# Patient Record
Sex: Female | Born: 1985
Health system: Southern US, Community
[De-identification: ages and names within clinical notes are randomized; demographics above are authoritative.]

## PROBLEM LIST (undated history)

## (undated) ENCOUNTER — Emergency Department (HOSPITAL_COMMUNITY)

## (undated) DIAGNOSIS — E119 Type 2 diabetes mellitus without complications: Secondary | ICD-10-CM

## (undated) DIAGNOSIS — O139 Gestational [pregnancy-induced] hypertension without significant proteinuria, unspecified trimester: Secondary | ICD-10-CM

## (undated) HISTORY — PX: APPENDECTOMY: SHX54

---

## 2019-09-08 NOTE — L&D Delivery Note (Addendum)
Patient: Kristine Bryant MRN: [id number]  GBS status: GBS positive, intrapartum antibiotic ppx given- PCN  Patient is a 34 y.o. now G1P1 s/p NSVD at [redacted]w[redacted]d, who was admitted for IOL 2/2 pre-E w/o SF. Outpatient FB was placed on 11/16 and pt was started on pitocin on arrival to L&D on 11/17. AROM 8h 3m prior to delivery with clear fluid. Uncomplicated delivery as noted below.   Delivery Note At 11:55 PM a viable female was delivered via Vaginal, Spontaneous (Presentation: Left Occiput Anterior).  APGAR: 9, 9; weight per medical record.   Placenta status: Spontaneous, Intact.  Cord: 3 vessel with the following complications: None.  Cord pH: gases not collected  Head delivered LOA. No nuchal cord present. Loose foot cord x1. Shoulder and body delivered in usual fashion. Infant with spontaneous cry, placed on mother's abdomen, dried and bulb suctioned. Cord clamped x 2 after 1-minute delay, and cut by FOB. Cord blood drawn. Placenta delivered spontaneously with gentle cord traction. Fundus firm with massage and Pitocin. Perineum inspected and found to have no lacerations.   Anesthesia: Epidural Episiotomy: None Lacerations: None Suture Repair:  N/A Est. Blood Loss (mL): 25  Mom to postpartum.  Baby to Couplet care / Skin to Skin.  Simone Autry-Lott 07/25/2020, 12:15 AM  I was gloved and present for delivery of infant and placenta and agree with resident's documentation as noted above.  Sheila Oats, MD OB Fellow, Faculty Practice 07/25/2020 1:49 AM

## 2019-12-26 ENCOUNTER — Encounter (HOSPITAL_COMMUNITY): Payer: Self-pay | Admitting: Family Medicine

## 2019-12-26 ENCOUNTER — Other Ambulatory Visit: Payer: Self-pay

## 2019-12-26 ENCOUNTER — Inpatient Hospital Stay (HOSPITAL_COMMUNITY)
Admission: AD | Admit: 2019-12-26 | Discharge: 2019-12-27 | Disposition: A | Discharge status: Home / Self Care | Payer: Self-pay | Attending: Family Medicine | Admitting: Family Medicine

## 2019-12-26 DIAGNOSIS — O209 Hemorrhage in early pregnancy, unspecified: Secondary | ICD-10-CM | POA: Insufficient documentation

## 2019-12-26 DIAGNOSIS — O3680X0 Pregnancy with inconclusive fetal viability, not applicable or unspecified: Secondary | ICD-10-CM

## 2019-12-26 DIAGNOSIS — Z3A08 8 weeks gestation of pregnancy: Secondary | ICD-10-CM | POA: Insufficient documentation

## 2019-12-26 LAB — POC URINE PREG, ED: Preg Test, Ur: POSITIVE — AB

## 2019-12-26 NOTE — MAU Note (Signed)
PT SAYS WITH INTERPRETER - [phone]- SAYS STARTED BLEEDING  8PM- ON UNDERWEAR . CRAMPS STARTED  STARTED - NO MEDS .

## 2019-12-26 NOTE — ED Notes (Signed)
Pt assessed by Sharilyn Sites, PA, ok to go to MAU. Report given to MAU and transport service called to transport pt to MAU.

## 2019-12-26 NOTE — MAU Provider Note (Signed)
Chief Complaint: Vaginal Bleeding   First Provider Initiated Contact with Patient 12/26/19 2349        SUBJECTIVE HPI: Kristine Bryant is a 34 y.o. G1P0 at [redacted]w[redacted]d by LMP who presents to maternity admissions reporting vaginal bleeding since 8pm.  Very little cramping  Had some blood tests "at [facility]" but plans care at [facility].  Has not had an [place] yet. . She denies vaginal itching/burning, urinary symptoms, h/a, dizziness, n/v, or fever/chills.    Vaginal Bleeding The patient's primary symptoms include pelvic pain and vaginal bleeding. The patient's pertinent negatives include no genital itching, genital lesions, genital odor or vaginal discharge. This is a new problem. The current episode started today. The problem occurs intermittently. The problem has been resolved. The pain is mild. She is pregnant. Pertinent negatives include no back pain, chills, constipation, diarrhea, dysuria, fever, frequency, headaches, nausea or vomiting. The vaginal discharge was bloody. The vaginal bleeding is spotting. She has not been passing clots. She has not been passing tissue. Nothing aggravates the symptoms. She has tried nothing for the symptoms.   RN Note: PT SAYS WITH INTERPRETER - [phone]- SAYS STARTED BLEEDING  8PM- ON UNDERWEAR . CRAMPS STARTED  STARTED - NO MEDS .  No past medical history on file.  Social History   Socioeconomic History  . Marital status: Single    Spouse name: Not on file  . Number of children: Not on file  . Years of education: Not on file  . Highest education level: Not on file  Occupational History  . Not on file  Tobacco Use  . Smoking status: Not on file  Substance and Sexual Activity  . Alcohol use: Not on file  . Drug use: Not on file  . Sexual activity: Not on file  Other Topics Concern  . Not on file  Social History Narrative  . Not on file   Social Determinants of Health   Financial Resource Strain:   . Difficulty of Paying Living Expenses:    Food Insecurity:   . Worried About Charity fundraiser in the Last Year:   . Arboriculturist in the Last Year:   Transportation Needs:   . Film/video editor (Medical):   Marland Kitchen Lack of Transportation (Non-Medical):   Physical Activity:   . Days of Exercise per Week:   . Minutes of Exercise per Session:   Stress:   . Feeling of Stress :   Social Connections:   . Frequency of Communication with Friends and Family:   . Frequency of Social Gatherings with Friends and Family:   . Attends Religious Services:   . Active Member of Clubs or Organizations:   . Attends Archivist Meetings:   Marland Kitchen Marital Status:   Intimate Partner Violence:   . Fear of Current or Ex-Partner:   . Emotionally Abused:   Marland Kitchen Physically Abused:   . Sexually Abused:    No current facility-administered medications on file prior to encounter.   No current outpatient medications on file prior to encounter.   No Known Allergies  I have reviewed patient's Past Medical Hx, Surgical Hx, Family Hx, Social Hx, medications and allergies.   ROS:  Review of Systems  Constitutional: Negative for chills and fever.  Gastrointestinal: Negative for constipation, diarrhea, nausea and vomiting.  Genitourinary: Positive for pelvic pain and vaginal bleeding. Negative for dysuria, frequency and vaginal discharge.  Musculoskeletal: Negative for back pain.  Neurological: Negative for headaches.   Review  of Systems  Other systems negative   Physical Exam  Physical Exam Patient Vitals for the past 24 hrs:  BP Temp Temp src Pulse Resp SpO2  12/26/19 2223 114/88 98 F (36.7 C) Oral 82 16 100 %   Constitutional: Well-developed, well-nourished female in no acute distress.  Cardiovascular: normal rate Respiratory: normal effort GI: Abd soft, non-tender. Pos BS x 4 MS: Extremities nontender, no edema, normal ROM Neurologic: Alert and oriented x 4.  GU: Neg CVAT.  PELVIC EXAM: Cervix pink, visually closed, without  lesion, scant white creamy discharge, vaginal walls and external genitalia normal   No blood visible  LAB RESULTS Results for orders placed or performed during the hospital encounter of 12/26/19 (from the past 24 hour(s))  POC Urine Pregnancy, ED (not at [facility])     Status: Abnormal   Collection Time: 12/26/19 10:33 PM  Result Value Ref Range   Preg Test, Ur POSITIVE (A) NEGATIVE  CBC     Status: None   Collection Time: 12/26/19 11:45 PM  Result Value Ref Range   WBC 10.2 4.0 - 10.5 K/uL   RBC 4.36 3.87 - 5.11 MIL/uL   Hemoglobin 13.3 12.0 - 15.0 g/dL   HCT 74.8 27.0 - 78.6 %   MCV 91.1 80.0 - 100.0 fL   MCH 30.5 26.0 - 34.0 pg   MCHC 33.5 30.0 - 36.0 g/dL   RDW 75.4 49.2 - 01.0 %   Platelets 303 150 - 400 K/uL   nRBC 0.0 0.0 - 0.2 %  hCG, quantitative, pregnancy     Status: Abnormal   Collection Time: 12/26/19 11:45 PM  Result Value Ref Range   hCG, Beta Chain, Quant, S 96,991 (H) <5 mIU/mL  ABO/Rh     Status: None   Collection Time: 12/26/19 11:45 PM  Result Value Ref Range   ABO/RH(D) O POS    No rh immune globuloin      NOT A RH IMMUNE GLOBULIN CANDIDATE, PT RH POSITIVE Performed at [facility], [address], [place], [place] [address]   Urinalysis, Routine w reflex microscopic     Status: None   Collection Time: 12/27/19 12:06 AM  Result Value Ref Range   Color, Urine YELLOW YELLOW   APPearance CLEAR CLEAR   Specific Gravity, Urine 1.012 1.005 - 1.030   pH 7.0 5.0 - 8.0   Glucose, UA NEGATIVE NEGATIVE mg/dL   Hgb urine dipstick NEGATIVE NEGATIVE   Bilirubin Urine NEGATIVE NEGATIVE   Ketones, ur NEGATIVE NEGATIVE mg/dL   Protein, ur NEGATIVE NEGATIVE mg/dL   Nitrite NEGATIVE NEGATIVE   Leukocytes,Ua NEGATIVE NEGATIVE  Wet prep, genital     Status: Abnormal   Collection Time: 12/27/19 12:15 AM  Result Value Ref Range   Yeast Wet Prep HPF POC NONE SEEN NONE SEEN   Trich, Wet Prep NONE SEEN NONE SEEN   Clue Cells Wet Prep HPF POC PRESENT (A) NONE SEEN    WBC, Wet Prep HPF POC MODERATE (A) NONE SEEN   Sperm NONE SEEN     IMAGING US OB Comp Less 14 Wks  Result Date: 12/27/2019 CLINICAL DATA:  Bleeding EXAM: OBSTETRIC <14 WK [place] AND TRANSVAGINAL OB US TECHNIQUE: Both transabdominal and transvaginal ultrasound examinations were performed for complete evaluation of the gestation as well as the maternal uterus, adnexal regions, and pelvic cul-de-sac. Transvaginal technique was performed to assess early pregnancy. COMPARISON:  None. FINDINGS: Intrauterine gestational sac: Single Yolk sac:  Visualized Embryo:  Visualized Cardiac Activity:  Visualized Heart Rate: 157 bpm MSD:   mm    w     d CRL:  11.4 mm   7 w   1 d                  [place] EDC: 08/13/2020 Subchorionic hemorrhage:  None visualized. Maternal uterus/adnexae: No adnexal mass or free fluid. IMPRESSION: Seven week 1 day intrauterine pregnancy. Fetal heart rate 157 beats per minute. No acute maternal findings. Electronically Signed   By: Charlett Nose M.D.   On: 12/27/2019 00:51     MAU Management/MDM: Ordered usual first trimester r/o ectopic labs.   Pelvic exam and cultures done Will check baseline Ultrasound to rule out ectopic.  This bleeding/pain can represent a normal pregnancy with bleeding, spontaneous abortion or even an ectopic which can be life-threatening.  The process as listed above helps to determine which of these is present.  Reviewed findings with patient who is pleased Reviewed she may continue to see some spotting from time to time Bleeding and pain precautions reviewed  ASSESSMENT Pregnancy at [redacted]w[redacted]d Bleeding in early pregnancy   PLAN Discharge home Pelvic rest Bleeding precautions Encouraged to seek prenatal care  Pt stable at time of discharge. Encouraged to return here or to other Urgent Care/ED if she develops worsening of symptoms, increase in pain, fever, or other concerning symptoms.    Wynelle Bourgeois CNM, MSN Certified Nurse-Midwife 12/26/2019  11:49  PM

## 2019-12-26 NOTE — ED Notes (Signed)
Sharilyn Sites PA called to assess pt on triage.

## 2019-12-26 NOTE — ED Provider Notes (Signed)
  MSE was initiated and I personally evaluated the patient and placed orders (if any) at  10:52 PM on December 26, 2019.  33 y.o. F here with vaginal bleeding.  History a little difficult due to language barrier, obtained with assistance from triage RN.  Reports she is about [redacted] weeks gestation.  Pregnancy test here is positive.  She is hemodynamically stable so will transfer to MAU for further work-up and care.  The patient appears stable so that the remainder of the MSE may be completed by another provider.    Garlon Hatchet, PA-C 12/26/19 2254    Gerhard Munch, MD 01/01/20 [phone]

## 2019-12-27 ENCOUNTER — Inpatient Hospital Stay (HOSPITAL_COMMUNITY): Payer: Self-pay

## 2019-12-27 DIAGNOSIS — O209 Hemorrhage in early pregnancy, unspecified: Secondary | ICD-10-CM

## 2019-12-27 DIAGNOSIS — O3680X0 Pregnancy with inconclusive fetal viability, not applicable or unspecified: Secondary | ICD-10-CM

## 2019-12-27 DIAGNOSIS — Z3A08 8 weeks gestation of pregnancy: Secondary | ICD-10-CM

## 2019-12-27 LAB — CBC
HCT: 39.7 % (ref 36.0–46.0)
Hemoglobin: 13.3 g/dL (ref 12.0–15.0)
MCH: 30.5 pg (ref 26.0–34.0)
MCHC: 33.5 g/dL (ref 30.0–36.0)
MCV: 91.1 fL (ref 80.0–100.0)
Platelets: 303 10*3/uL (ref 150–400)
RBC: 4.36 MIL/uL (ref 3.87–5.11)
RDW: 12.8 % (ref 11.5–15.5)
WBC: 10.2 10*3/uL (ref 4.0–10.5)
nRBC: 0 % (ref 0.0–0.2)

## 2019-12-27 LAB — WET PREP, GENITAL
Sperm: NONE SEEN
Trich, Wet Prep: NONE SEEN
Yeast Wet Prep HPF POC: NONE SEEN

## 2019-12-27 LAB — GC/CHLAMYDIA PROBE AMP (~~LOC~~) NOT AT ARMC
Chlamydia: NEGATIVE
Comment: NEGATIVE
Comment: NORMAL
Neisseria Gonorrhea: NEGATIVE

## 2019-12-27 LAB — HCG, QUANTITATIVE, PREGNANCY: hCG, Beta Chain, Quant, S: 96991 m[IU]/mL — ABNORMAL HIGH (ref <5)

## 2019-12-27 LAB — URINALYSIS, ROUTINE W REFLEX MICROSCOPIC
Bilirubin Urine: NEGATIVE
Glucose, UA: NEGATIVE mg/dL
Hgb urine dipstick: NEGATIVE
Ketones, ur: NEGATIVE mg/dL
Leukocytes,Ua: NEGATIVE
Nitrite: NEGATIVE
Protein, ur: NEGATIVE mg/dL
Specific Gravity, Urine: 1.012 (ref 1.005–1.030)
pH: 7 (ref 5.0–8.0)

## 2019-12-27 LAB — HIV ANTIBODY (ROUTINE TESTING W REFLEX): HIV Screen 4th Generation wRfx: NONREACTIVE

## 2019-12-27 LAB — ABO/RH: ABO/RH(D): O POS

## 2019-12-27 NOTE — Discharge Instructions (Signed)
Primer trimestre de Psychiatrist First Trimester of Pregnancy  El primer trimestre de Psychiatrist se extiende desde la semana1 hasta el final de la semana13 (mes1 al mes3). Durante este tiempo, el beb comenzar a desarrollarse dentro suyo. Entre la semana6 y [place], se forman los ojos y Recruitment consultant, y los latidos del corazn pueden escucharse en la ecografa. Al final de las 12semanas, todos los rganos del beb estn formados. El cuidado prenatal es toda la asistencia mdica que usted recibe antes del nacimiento del beb. Asegrese de recibir un buen cuidado prenatal y de seguir todas las indicaciones del mdico. Siga estas indicaciones en su casa: Medicamentos  Tome los medicamentos de venta libre y los recetados solamente como se lo haya indicado el mdico. Algunos medicamentos son seguros para tomar durante el Psychiatrist y otros no lo son.  Tome vitaminas prenatales que contengan por lo menos (?g) de cido flico.  Si tiene problemas para defecar (estreimiento), tome un medicamento que ablanda la materia fecal (laxante), siempre que lo autorice el mdico. Comida y bebida   Ingiera alimentos saludables de [place] regular.  El Firefighter la cantidad de peso que [place].  No coma carne cruda ni quesos sin cocinar.  Si tiene Programme researcher, broadcasting/film/video (nuseas) o vomita (vmitos): ? Ingiera 4 o 5comidas pequeas por Geophysical data processor de 3abundantes. ? Intente comer algunas galletitas saladas. ? Beba lquidos [employer], en lugar de [employer].  Para evitar el estreimiento: ? Consuma alimentos ricos en fibra, como frutas y verduras frescas, cereales integrales y legumbres. ? Beba suficiente lquido para mantener el pis (orina) claro o de color amarillo plido. Actividad  Haga ejercicios solamente como se lo haya indicado el mdico. Deje de hacer ejercicios si tiene clicos o dolor en la parte baja del vientre (abdomen) o en la cintura.  No haga  actividad fsica si el clima est demasiado caluroso o hmedo, o si se encuentra en un lugar muy alto (altitud elevada).  Intente no estar de pie FedEx. Mueva las piernas con frecuencia si debe estar de pie en un lugar durante mucho tiempo.  Evite levantar pesos [employer]ands.  Use zapatos con tacones bajos. Mantenga una buena postura al sentarse y pararse.  Puede tener [employer] [place] [employer], a menos que el mdico le indique lo contrario. Alivio del dolor y del Dentist  Use un sostn que le brinde buen soporte si le duelen las [place].  Dese baos de asiento con agua tibia para Engineer, materials o las molestias causadas por las hemorroides. Use una crema antihemorroidal si el mdico se lo permite.  Descanse con las piernas elevadas si tiene calambres o dolor de cintura.  Si tiene las venas de las piernas hinchadas y abultadas (venas varicosas): ? Use medias elsticas de soporte o medias de compresin como se lo haya indicado el mdico. ? Levante (eleve) los pies durante , 3 o 4veces por Futures trader. ? Limite la sal en sus alimentos. Cuidado prenatal  Programe las visitas prenatales para la semana12 de [place].  Escriba sus preguntas. Llvelas cuando concurra a las visitas prenatales.  Concurra a todas las visitas prenatales como se lo haya indicado el mdico. Esto es importante. Seguridad  Use el cinturn de seguridad en todo momento mientras conduce.  Haga una lista con los nmeros de telfono en caso de Associate Professor. Esta lista debe incluir los nmeros de los familiares, los amigos, el hospital y los departamentos de polica y de bomberos. Instrucciones generales  Pdale  al mdico que la derive a clases prenatales en su localidad. Debe comenzar a tomar las clases antes de entrar en el mes6 de embarazo.  Pida ayuda si necesita asesoramiento o asistencia con la alimentacin. El mdico puede aconsejarla o indicarle dnde recurrir para recibir ayuda.  No se d baos de  inmersin en agua caliente, baos turcos ni saunas.  No se haga duchas vaginales ni use tampones o toallas higinicas perfumadas.  No mantenga las piernas cruzadas durante mucho tiempo.  Evite las hierbas y el alcohol. Evite los frmacos que el mdico no haya autorizado.  No consuma ningn producto que contenga tabaco, lo que incluye cigarrillos, tabaco de mascar o cigarrillos electrnicos. Si necesita ayuda para dejar de fumar, consulte al mdico. Puede recibir asesoramiento u otro tipo de apoyo para dejar de fumar.  Evite el contacto con las bandejas sanitarias de los gatos y la tierra que estos animales usan. Estos elementos contienen grmenes que pueden causar defectos congnitos al beb y la posible prdida del feto (aborto espontneo) o muerte fetal.  Visite al dentista. En su casa, lvese los dientes con un cepillo dental suave. Psese el hilo dental con suavidad. Comunquese con un mdico si:  Tiene mareos.  Tiene clicos leves o siente presin en la parte baja del vientre.  Sufre un dolor persistente en el abdomen.  Sigue teniendo malestar estomacal, vomita o la materia fecal es lquida (diarrea).  Nota una secrecin de lquido con olor ftido que proviene de la vagina.  Tiene dolor al hacer pis (orinar).  Tiene el rostro, las manos, las piernas o los tobillos ms hinchados (inflamados). Solicite ayuda de inmediato si:  Tiene fiebre.  Tiene una prdida de lquido por la vagina.  Tiene sangrado o pequeas prdidas vaginales.  Tiene clicos o dolor muy intensos en el vientre.  Sube o baja de peso rpidamente.  Vomita sangre. Esto tiene un aspecto similar a la borra del caf.  Est en contacto con personas que tienen rubola, la quinta enfermedad o varicela.  Siente un dolor de cabeza muy intenso.  Le falta el aire.  Sufre cualquier tipo de traumatismo, por ejemplo, debido a una cada o un accidente automovilstico. Resumen  El primer trimestre de embarazo se  extiende desde la semana1 hasta el final de la semana13 (mes1 al mes3).  Para cuidar su salud y la del beb en gestacin, necesitar consumir alimentos saludables, tomar medicamentos solamente si lo autoriza el mdico, y hacer actividades que sean seguras para usted y para su beb.  Concurra a todas las visitas de control como se lo haya indicado el mdico. Esto es importante porque el mdico deber asegurar que el beb est saludable y est creciendo bien. Esta informacin no tiene como fin reemplazar el consejo del mdico. Asegrese de hacerle al mdico cualquier pregunta que tenga. Document Revised: 03/30/2017 Document Reviewed: 03/30/2017 Elsevier Patient Education  2020 Elsevier Inc.  

## 2020-01-31 ENCOUNTER — Other Ambulatory Visit: Payer: Self-pay | Admitting: Family Medicine

## 2020-01-31 ENCOUNTER — Other Ambulatory Visit: Payer: Self-pay

## 2020-01-31 DIAGNOSIS — Z3401 Encounter for supervision of normal first pregnancy, first trimester: Secondary | ICD-10-CM

## 2020-01-31 NOTE — Progress Notes (Signed)
Labs placed. PCP for Pregnancy will be Dr. Clent Ridges.   Terisa Starr, MD  Family Medicine Teaching Service

## 2020-02-02 LAB — OBSTETRIC PANEL, INCLUDING HIV
Antibody Screen: NEGATIVE
Basophils Absolute: 0 10*3/uL (ref 0.0–0.2)
Basos: 1 %
EOS (ABSOLUTE): 0.1 10*3/uL (ref 0.0–0.4)
Eos: 1 %
HIV Screen 4th Generation wRfx: NONREACTIVE
Hematocrit: 35.8 % (ref 34.0–46.6)
Hemoglobin: 11.9 g/dL (ref 11.1–15.9)
Hepatitis B Surface Ag: NEGATIVE
Immature Grans (Abs): 0 10*3/uL (ref 0.0–0.1)
Immature Granulocytes: 0 %
Lymphocytes Absolute: 2.6 10*3/uL (ref 0.7–3.1)
Lymphs: 32 %
MCH: 29.7 pg (ref 26.6–33.0)
MCHC: 33.2 g/dL (ref 31.5–35.7)
MCV: 89 fL (ref 79–97)
Monocytes Absolute: 0.6 10*3/uL (ref 0.1–0.9)
Monocytes: 7 %
Neutrophils Absolute: 5 10*3/uL (ref 1.4–7.0)
Neutrophils: 59 %
Platelets: 253 10*3/uL (ref 150–450)
RBC: 4.01 x10E6/uL (ref 3.77–5.28)
RDW: 12.7 % (ref 11.7–15.4)
RPR Ser Ql: NONREACTIVE
Rh Factor: POSITIVE
Rubella Antibodies, IGG: 11.5 index (ref >0.99)
WBC: 8.3 10*3/uL (ref 3.4–10.8)

## 2020-02-02 LAB — HGB FRACTIONATION CASCADE
Hgb A2: 2.7 % (ref 1.8–3.2)
Hgb A: 97.3 % (ref 96.4–98.8)
Hgb F: 0 % (ref 0.0–2.0)
Hgb S: 0 %

## 2020-02-04 LAB — URINE CULTURE, OB REFLEX

## 2020-02-04 LAB — CULTURE, OB URINE

## 2020-02-06 NOTE — Progress Notes (Signed)
Patient Name: Kristine Bryant Date of Birth: 1986/03/06 Rule Initial Prenatal Visit  Kristine Bryant is a 34 y.o. year old G1P0 at [redacted]w[redacted]d who presents for her initial prenatal visit. Pregnancy is planned She reports morning sickness. She is taking a prenatal vitamin.  She denies pelvic pain or vaginal bleeding.   Pregnancy Dating: . The patient is dated by ultrasound  . LMP: 10/25/19 . Period is certain:  No.  . Periods were regular:  Yes.  Marland Kitchen LMP was a typical period:  Yes.  . Using hormonal contraception in 3 months prior to conception: No  Lab Review: . Blood type: O . Rh Status: + . Antibody screen: Negative . HIV: Negative . RPR: Negative . Hemoglobin electrophoresis reviewed: Yes . Results of OB urine culture are: Positive for GBS bacteriuria  . Rubella: Immune . Varicella status is Unknown  PMH: Reviewed and as detailed below: . HTN: No  . Type 1 or 2 Diabetes: No  . Depression:  No  . Seizure disorder:  No . VTE: No ,  . History of STI No,  . Abnormal Pap smear:  No, . Genital herpes simplex:  No   PSH: . Gynecologic Surgery:  no . Surgical history reviewed, notable for: none  Obstetric History: . Obstetric history tab updated and reviewed.  . Summary of prior pregnancies: 0 . Cesarean delivery: n/a . Gestational Diabetes:  n/a . Hypertension in pregnancy: n/a . History of preterm birth: n/a . History of LGA/SGA infant:  n/a . History of shoulder dystocia: n/a . Indications for referral were reviewed, and the patient has no obstetric indications for referral to [facility] at this time.   Social History: . Partner's name: Alison Stalling . Tobacco use: No . Alcohol use:  No . Other substance use:  No  Current Medications:  . Prenatal Vitamens  . Reviewed and appropriate in pregnancy.   Genetic and Infection Screen: . Flow Sheet Updated Yes  Prenatal Exam: Gen: Well nourished, well developed.  No  distress.  Vitals noted. HEENT: Normocephalic, atraumatic.  Neck supple without cervical lymphadenopathy, thyromegaly or thyroid nodules.  Fair dentition. CV: RRR no murmur, gallops or rubs Lungs: CTA B.  Normal respiratory effort without wheezes or rales. Abd: soft, NTND. +BS.  Uterus not appreciated above pelvis. GU: Normal external female genitalia without lesions.  Nl vaginal, well rugated without lesions. No vaginal discharge.  Bimanual exam: No adnexal mass or TTP. No CMT.  Uterus size  Ext: No clubbing, cyanosis or edema. Psych: Normal grooming and dress.  Not depressed or anxious appearing.  Normal thought content and process without flight of ideas or looseness of associations, assessed via in person interpreter  Fetal heart tones: Appropriate  Assessment/Plan:  [name] is a 34 y.o. G1P0 at [redacted]w[redacted]d who presents to initiate prenatal care. She is doing well.  Current pregnancy issues include Nausea.  1. Routine prenatal care: Marland Kitchen As dating is not reliable, a dating ultrasound was ordered on 04/21. Dating tab updated. . Pre-pregnancy weight updated. Expected weight gain this pregnancy is 11-20 pounds  . Prenatal labs reviewed, notable for GBS bacteriuria. . Indications for referral to [facility] were reviewed and the patient does not meet criteria for referral.  . Medication list reviewed and updated.  . Recommended patient see a dentist for regular care.  . Bleeding and pain precautions reviewed. . Importance of prenatal vitamins reviewed.  . Genetic screening offered. Patient opted for: quad screen  at 16-20 weeks. . The patient will not be age 43 or over at time of delivery. Referral to genetic counseling was not offered today.  . The patient has the following risk factors for preexisting diabetes: BMI >25  and a FDR with diabetes . An early 1 hour glucose tolerance test was not ordered today.   . Pregnancy Medical Home and PHQ-9 forms completed, problems noted: Yes  2.  Discussed limit caffeine intake, avoid seafood high in mercury and alcohol.      Follow up 4 weeks for next prenatal visit.

## 2020-02-07 ENCOUNTER — Ambulatory Visit (INDEPENDENT_AMBULATORY_CARE_PROVIDER_SITE_OTHER): Payer: Self-pay | Admitting: Family Medicine

## 2020-02-07 ENCOUNTER — Other Ambulatory Visit: Payer: Self-pay

## 2020-02-07 ENCOUNTER — Other Ambulatory Visit (HOSPITAL_COMMUNITY)
Admission: RE | Admit: 2020-02-07 | Discharge: 2020-02-07 | Disposition: A | Discharge status: Home / Self Care | Payer: Self-pay | Source: Ambulatory Visit | Attending: Family Medicine | Admitting: Family Medicine

## 2020-02-07 ENCOUNTER — Encounter: Payer: Self-pay | Admitting: Family Medicine

## 2020-02-07 VITALS — BP 102/64 | HR 98 | Wt 176.4 lb

## 2020-02-07 DIAGNOSIS — Z3480 Encounter for supervision of other normal pregnancy, unspecified trimester: Secondary | ICD-10-CM

## 2020-02-07 DIAGNOSIS — Z3A13 13 weeks gestation of pregnancy: Secondary | ICD-10-CM | POA: Insufficient documentation

## 2020-02-07 DIAGNOSIS — O2341 Unspecified infection of urinary tract in pregnancy, first trimester: Secondary | ICD-10-CM | POA: Insufficient documentation

## 2020-02-07 DIAGNOSIS — B951 Streptococcus, group B, as the cause of diseases classified elsewhere: Secondary | ICD-10-CM | POA: Insufficient documentation

## 2020-02-07 DIAGNOSIS — Z8619 Personal history of other infectious and parasitic diseases: Secondary | ICD-10-CM | POA: Insufficient documentation

## 2020-02-07 HISTORY — DX: Encounter for supervision of other normal pregnancy, unspecified trimester: Z34.80

## 2020-02-07 MED ORDER — AMOXICILLIN 500 MG PO CAPS: 500.0000 mg | ORAL_CAPSULE | Freq: Three times a day (TID) | ORAL | 0 refills | Status: AC

## 2020-02-07 MED ORDER — DOXYLAMINE-PYRIDOXINE 10-10 MG PO TBEC: DELAYED_RELEASE_TABLET | ORAL | 3 refills | Status: DC

## 2020-02-07 MED FILL — AMOXICILLIN 500 MG CAPSULE: 500 | 5 days supply | Qty: 15 | Fill #0

## 2020-02-07 NOTE — Progress Notes (Signed)
Treated

## 2020-02-07 NOTE — Patient Instructions (Addendum)
It was a pleasure meeting you today.   You are now  13 weeks and 1 day  pregnant.  Your urine was positive for GBS and we will treat this with Amoxicillin.  It is important to take this medication until completed to prevent further complications in pregnancy.  Continue to prenatal vitamins daily  I have also sent a prescription for nausea medication  Follow up in 4 weeks   Primer trimestre de Psychiatrist First Trimester of Pregnancy El primer trimestre de Psychiatrist se extiende desde la semana1 hasta el final de la semana13 (mes1 al mes3). Una semana despus de que un espermatozoide fecunda un vulo, este se implantar en la pared uterina. Este embrin comenzar a Camera operator convertirse en un beb. Sus genes y los de su pareja forman el beb. Los genes del varn determinan si ser un nio o una nia. Entre la semana6 y [name], se forman los ojos y Recruitment consultant, y los latidos del corazn pueden verse en una ecografa. Al final de las 12semanas, todos los rganos del beb estn formados. Ahora que est embarazada, querr hacer todo lo que est a su alcance para tener un beb sano. Dos de las cosas ms importantes son Kristine Bryant un buen cuidado prenatal y seguir las indicaciones del mdico. El cuidado prenatal incluye toda la asistencia mdica que usted recibe antes del nacimiento del beb. Esto ayudar a Kristine Bryant, Kristine Bryant y tratar cualquier problema durante el embarazo y Kristine Bryant. Durante Financial risk analyst trimestre, ocurren cambios en el cuerpo. Su organismo atraviesa por muchos cambios durante el Kristine Bryant. Estos cambios varan de Kristine Bryant a Kristine Bryant.  Al principio, puede aumentar o bajar algunos kilos.  Puede tener Programme researcher, broadcasting/film/video (nuseas) y vomitar. Si no puede controlar los vmitos, llame al mdico.  Puede cansarse con facilidad.  Es posible que tenga dolores de cabeza que pueden aliviarse con ciertos medicamentos. Todos los Kristine Bryant que tome deben estar aprobados por el mdico.  Puede  orinar con mayor frecuencia. El dolor al orinar puede significar que usted tiene una infeccin de la vejiga.  Debido al Kristine Bryant, puede tener acidez estomacal.  Puede estar estreida, ya que ciertas hormonas enlentecen los movimientos de los msculos que empujan las heces a travs del intestino.  Pueden aparecer hemorroides o hincharse las venas (venas varicosas).  Las Kristine Bryant pueden empezar a Kristine Bryant y Kristine Bryant. Los pezones pueden sobresalir ms, y el tejido que los rodea (arola) tornarse ms oscuro.  Las Veterinary surgeon y estar sensibles al cepillado y al hilo dental.  Pueden aparecer zonas oscuras o manchas (cloasma, mscara del [place]) en el rostro. Esto probablemente se atenuar despus del nacimiento del beb.  Los perodos menstruales se interrumpirn.  Tal vez no tenga apetito.  Puede sentir un fuerte deseo de consumir ciertos alimentos.  Puede tener cambios a Theatre Bryant a da, por ejemplo, por momentos puede estar emocionada por el Psychiatrist y por otros preocuparse porque algo pueda salir mal con el embarazo o el beb.  Tendr sueos ms vvidos y extraos.  Tal vez haya cambios en el cabello. Esto cambios pueden incluir su engrosamiento, crecimiento rpido y Kristine Bryant textura. Adems, a algunas mujeres se les cae el cabello durante o despus del embarazo, o tienen el cabello seco o fino. Lo ms probable es que el cabello se le normalice despus del nacimiento del beb. Qu debe esperar en las visitas prenatales Durante una visita prenatal de rutina:  La pesarn para asegurarse de que usted y Kristine Bryant  beb estn creciendo normalmente.  Le tomarn la presin arterial.  Le medirn el abdomen para controlar el desarrollo del beb.  Se escucharn los latidos cardacos fetales entre las semanas10 y14 de Kristine Bryant.  Se analizarn los resultados de los estudios solicitados en visitas anteriores. El mdico puede preguntarle lo siguiente:  Cmo se  siente.  Si siente los movimientos del beb.  Si ha tenido sntomas anormales, como prdida de lquido, Kristine Bryant, dolores de cabeza intensos o clicos abdominales.  Si est consumiendo algn producto que contenga tabaco, como cigarrillos, tabaco de Higher education careers adviser y Psychologist, sport and exercise.  Si tiene [employer]. Otros estudios que pueden realizarse durante el primer trimestre incluyen lo siguiente:  Anlisis de sangre para determinar el grupo sanguneo y Hydrographic surveyor la presencia de infecciones previas. Las pruebas tambin se utilizarn para Teacher, adult education si tiene bajo nivel de hierro (anemia) y protenas en los glbulos rojos (anticuerpos Rh). En funcin de sus factores de riesgo, o si ya tuvo diabetes durante un embarazo anterior, le pueden hacer pruebas para determinar si tiene un nivel alto de azcar en la sangre, algo que puede afectar a embarazadas (diabetes gestacional).  Anlisis de orina para detectar infecciones, diabetes o protenas en la orina.  Una ecografa para confirmar que el beb crece y se desarrolla correctamente.  Estudios fetales para Hydrographic surveyor problemas de la mdula espinal (espina bfida) y sndrome de [place].  Prueba del VIH (virus de inmunodeficiencia humana). Los exmenes prenatales de rutina incluyen la prueba de deteccin del VIH, a menos que decida no Radiation protection practitioner.  Es posible que necesite otras pruebas adicionales. Siga estas instrucciones en su casa: [name] indicaciones del mdico en relacin con el uso de medicamentos. Durante el embarazo, hay medicamentos que pueden tomarse y 104 que no.  Tome vitaminas prenatales que contengan por lo menos [id number] (?g) de cido flico.  Si est estreida, tome un laxante suave, si el mdico lo autoriza. Comida y bebida   Kristine Bryant dieta equilibrada que incluya gran cantidad de frutas y verduras frescas, cereales integrales, buenas fuentes de protenas como carnes Kristine Bryant, huevos o tofu, y lcteos  descremados. El mdico la ayudar a Kristine Bryant cantidad de peso que puede [place].  No coma carne cruda ni quesos sin cocinar. Estos elementos contienen grmenes que pueden causar defectos congnitos en el beb.  La ingesta diaria de cuatro o cinco comidas pequeas en lugar de tres comidas abundantes puede ayudar a [employer] nuseas y los vmitos. Si empieza a tener nuseas, comer algunas galletas saladas puede ser de [place]. Beber lquidos [employer], Engineer, technical sales de tomarlos durante las comidas, tambin puede ayudar a Public house Bryant las nuseas y los vmitos.  Limite el consumo de alimentos con alto contenido de grasas y azcares procesados, como alimentos fritos o dulces.  Para evitar el estreimiento: ? Consuma alimentos ricos en fibra, como frutas y verduras frescas, cereales integrales y frijoles. ? Beba suficiente lquido para mantener la orina clara o de color amarillo plido. Actividad  Haga ejercicio solamente como se lo haya indicado el mdico. La mayora de las mujeres pueden continuar su rutina de ejercicios durante el [place]. Intente realizar como mnimo 44minutos de actividad fsica por lo menos 5das a la semana. El ejercicio la ayudar a: ? Engineer, technical sales. ? Mantenerse en forma. ? Estar preparada para el trabajo de parto y [place].  Los dolores, los clicos en la parte baja del abdomen o los calambres en la cintura son un buen indicio de que debe dejar  de hacer ejercicios. Consulte al mdico antes de seguir haciendo ejercicios con normalidad.  Intente no estar de pie [employer]. Mueva las piernas con frecuencia si debe estar de pie en un lugar durante mucho tiempo.  Evite levantar pesos [employer].  Use zapatos de tacones bajos y [place].  Puede seguir teniendo [employer] [place], salvo que el mdico le indique lo contrario. Alivio del dolor y del Dentist  Use un sostn que le brinde buen soporte para Engineer, materials de  [place].  Dese baos de asiento con agua tibia para Engineer, materials o las molestias causadas por las hemorroides. Use una crema para las hemorroides si el mdico la autoriza.  Descanse con las piernas elevadas si tiene calambres o dolor de cintura.  Si tiene venas varicosas en las piernas, use medias de descanso. Eleve los pies durante , 3 o 4veces por da. Limite el consumo de sal en su dieta. Cuidados prenatales  Programe las visitas prenatales para la semana12 de [place]. Generalmente se programan cada mes al principio y se hacen ms frecuentes en los 2 ltimos meses antes del parto.  Escriba sus preguntas. Llvelas cuando concurra a las visitas prenatales.  Concurra a todas las visitas prenatales tal como se lo haya indicado el mdico. Esto es importante. Seguridad  Use el cinturn de seguridad en todo momento mientras conduce.  Haga una lista de los nmeros de telfono de Associate Professor, que [employer] nmeros de telfono de familiares, [place], el hospital y los departamentos de polica y bomberos. Instrucciones generales  Pdale al mdico que la derive a clases de educacin prenatal en su localidad. Debe comenzar a tomar las clases antes de que empiece el mes6 de [place].  Pida ayuda si tiene necesidades nutricionales o de asesoramiento Academic librarian. El mdico puede aconsejarla o derivarla a especialistas para que la ayuden con diferentes necesidades.  No se d baos de inmersin en agua caliente, baos turcos ni saunas.  No se haga duchas vaginales ni use tampones o toallas higinicas perfumadas.  No mantenga las piernas cruzadas durante [place].  Evite el contacto con las bandejas sanitarias de los gatos y la tierra que estos animales usan. Estos elementos contienen bacterias que pueden causar defectos congnitos al beb y la posible prdida del feto debido a un aborto espontneo o muerte fetal.  No fume, no consuma hierbas ni medicamentos que no hayan  sido recetados por el mdico. Las sustancias qumicas que estos productos contienen afectan la formacin y el desarrollo del beb.  No consuma ningn producto que contenga nicotina o tabaco, como cigarrillos y Administrator, Civil Service. Si necesita ayuda para dejar de fumar, consulte al [employer]. Puede recibir asesoramiento y otro tipo de recursos para dejar de fumar.  Programe una cita con el dentista. En su casa, lvese los dientes con un cepillo dental blando y psese el hilo dental con suavidad. Comunquese con un mdico si:  Tiene mareos.  Siente clicos leves, presin en la pelvis o dolor persistente en el abdomen.  Tiene nuseas, vmitos o diarrea persistentes.  Brett Fairy secrecin vaginal con mal olor.  Siente dolor al [employer]oco[employer].  Observa ms hinchazn en la cara, las manos, las piernas o los tobillos.  Est expuesta a la quinta enfermedad o a la varicela.  Est expuesta al sarampin alemn (rubola) y nunca lo haba tenido. Solicite ayuda de inmediato si:  Tiene fiebre.  Tiene una prdida de lquido por la vagina.  Tiene sangrado o pequeas prdidas vaginales.  Siente dolor intenso o clicos en el abdomen.  Sube o baja de peso rpidamente.  Vomita sangre de color rojo brillante o una sustancia similar a los granos de caf.  Dolor de cabeza intenso.  Le falta el aire.  Sufre cualquier tipo de traumatismo, por ejemplo, debido a una cada o un accidente automovilstico. Resumen  El primer trimestre de Psychiatrist se extiende desde la semana1 hasta el final de la semana13 (mes1 al mes3).  Su organismo atraviesa por muchos cambios durante el [place]. Estos cambios varan de [place] a Kristine Bryant.  Tendr visitas prenatales de rutina. Durante esas visitas, el mdico la examinar, hablar con usted acerca de los resultados de sus pruebas y Network engineer cmo se siente. Esta informacin no tiene [place] park Bryant el consejo del mdico. Asegrese de hacerle al mdico  cualquier pregunta que tenga. Document Revised: 11/27/2016 Document Reviewed: 11/27/2016 Elsevier Patient Education  2020 Elsevier Inc.  Dana Allan, MD Family Medicine Residency

## 2020-02-08 ENCOUNTER — Other Ambulatory Visit: Payer: Self-pay | Admitting: Family Medicine

## 2020-02-08 LAB — TSH: TSH: 1.52 u[IU]/mL (ref 0.450–4.500)

## 2020-02-09 LAB — CYTOLOGY - PAP
Chlamydia: NEGATIVE
Comment: NEGATIVE
Comment: NEGATIVE
Comment: NEGATIVE
Comment: NORMAL
Diagnosis: NEGATIVE
High risk HPV: NEGATIVE
Neisseria Gonorrhea: NEGATIVE
Trichomonas: NEGATIVE

## 2020-02-15 ENCOUNTER — Telehealth: Payer: Self-pay

## 2020-02-15 NOTE — Telephone Encounter (Signed)
[facility] is requesting a change in medication. Doxylamine-Pyridoxine is $640. Can it be changed to alternate medication. Patient does not have insurance. Sunday Spillers, CMA

## 2020-02-15 NOTE — Telephone Encounter (Signed)
Please contact patient and let he know that she can buy these medications separately over the counter and should be cheaper.  The medications she needs to get are:  Vitamin B6 one tab at night Doxylamine Sleep Tablets 25mg  one tab at night.  The other name for this medication is Unisom  If she has any questions she can call to discuss.  Thank you   , MD Family Medicine Residency

## 2020-02-20 NOTE — Telephone Encounter (Signed)
Spoke with pt and informed of below using [employer],Pamela [phone]. Pt reports no nausea at this time. Sunday Spillers, CMA

## 2020-03-07 NOTE — Progress Notes (Signed)
Kristine Bryant is a 34 y.o. G1P0 at 100w2d here for routine follow up.  She reports intermittent difficulty sleeping. See flow sheet for details.  Pertinent items are noted in HPI.  Last menstrual period 10/27/2019. BP 110/70   Pulse 92   Wt 179 lb 6.4 oz (81.4 kg)   LMP 10/27/2019   BMI 31.78 kg/m  FHT present and appropriate.  Lab Results  Component Value Date   WBC 8.3 01/31/2020   HGB 11.9 01/31/2020   HCT 35.8 01/31/2020   MCV 89 01/31/2020   PLT 253 01/31/2020           ABO, Rh: O/Positive/-- (05/26 1125)  Antibody: Negative (05/26 1125)  Rubella: 11.50 (05/26 1125)  RPR: Non Reactive (05/26 1125)  HBsAg: Negative (05/26 1125)  HIV: Non Reactive (05/26 1125)  GBS:       A/P: Pregnancy at [redacted]w[redacted]d.  Doing well.   Pregnancy issues include Difficulty sleeping. Trial Unisom tablets 10mg  qhs.  Avoid Melatonin as unclear if safe in pregnancy. Anatomy ultrasound ordered to be scheduled at 18-19 weeks. Patient is interested in genetic screening. Quad screen ordered.  Hep C screening. Recently completed treatment for GBS Bacteriuria.  Repeat OB urine POCT today. Bleeding and pain precautions reviewed. Follow up 4 weeks.

## 2020-03-07 NOTE — Patient Instructions (Signed)
Thank you for coming to see me today. It was a pleasure. Today we talked about:     Please follow-up with me in 4weeks  If you have any questions or concerns, please do not hesitate to call the office at [phone].  Best,  Dana Allan, MD Family Medicine Residency  Second Trimester of Pregnancy  The second trimester is from week 14 through week 27 (month 4 through 6). This is often the time in pregnancy that you feel your best. Often times, morning sickness has lessened or quit. You may have more energy, and you may get hungry more often. Your unborn baby is growing rapidly. At the end of the sixth month, he or she is about 9 inches long and weighs about 1 pounds. You will likely feel the baby move between 18 and 20 weeks of pregnancy. Follow these instructions at home: Medicines  Take over-the-counter and prescription medicines only as told by your doctor. Some medicines are safe and some medicines are not safe during pregnancy.  Take a prenatal vitamin that contains at least 600 micrograms (mcg) of folic acid.  If you have trouble pooping (constipation), take medicine that will make your stool soft (stool softener) if your doctor approves. Eating and drinking   Eat regular, healthy meals.  Avoid raw meat and uncooked cheese.  If you get low calcium from the food you eat, talk to your doctor about taking a daily calcium supplement.  Avoid foods that are high in fat and sugars, such as fried and sweet foods.  If you feel sick to your stomach (nauseous) or throw up (vomit): ? Eat 4 or 5 small meals a day instead of 3 large meals. ? Try eating a few soda crackers. ? Drink liquids between meals instead of during meals.  To prevent constipation: ? Eat foods that are high in fiber, like fresh fruits and vegetables, whole grains, and beans. ? Drink enough fluids to keep your pee (urine) clear or pale yellow. Activity  Exercise only as told by your doctor. Stop exercising if  you start to have cramps.  Do not exercise if it is too hot, too humid, or if you are in a place of great height (high altitude).  Avoid heavy lifting.  Wear low-heeled shoes. Sit and stand up straight.  You can continue to have sex unless your doctor tells you not to. Relieving pain and discomfort  Wear a good support bra if your breasts are tender.  Take warm water baths (sitz baths) to soothe pain or discomfort caused by hemorrhoids. Use hemorrhoid cream if your doctor approves.  Rest with your legs raised if you have leg cramps or low back pain.  If you develop puffy, bulging veins (varicose veins) in your legs: ? Wear support hose or compression stockings as told by your doctor. ? Raise (elevate) your feet for 15 minutes, 3-4 times a day. ? Limit salt in your food. Prenatal care  Write down your questions. Take them to your prenatal visits.  Keep all your prenatal visits as told by your doctor. This is important. Safety  Wear your seat belt when driving.  Make a list of emergency phone numbers, including numbers for family, friends, the hospital, and police and fire departments. General instructions  Ask your doctor about the right foods to eat or for help finding a counselor, if you need these services.  Ask your doctor about local prenatal classes. Begin classes before month 6 of your pregnancy.  Do  not use hot tubs, steam rooms, or saunas.  Do not douche or use tampons or scented sanitary pads.  Do not cross your legs for long periods of time.  Visit your dentist if you have not done so. Use a soft toothbrush to brush your teeth. Floss gently.  Avoid all smoking, herbs, and alcohol. Avoid drugs that are not approved by your doctor.  Do not use any products that contain nicotine or tobacco, such as cigarettes and e-cigarettes. If you need help quitting, ask your doctor.  Avoid cat litter boxes and soil used by cats. These carry germs that can cause birth  defects in the baby and can cause a loss of your baby (miscarriage) or stillbirth. Contact a doctor if:  You have mild cramps or pressure in your lower belly.  You have pain when you pee (urinate).  You have bad smelling fluid coming from your vagina.  You continue to feel sick to your stomach (nauseous), throw up (vomit), or have watery poop (diarrhea).  You have a nagging pain in your belly area.  You feel dizzy. Get help right away if:  You have a fever.  You are leaking fluid from your vagina.  You have spotting or bleeding from your vagina.  You have severe belly cramping or pain.  You lose or gain weight rapidly.  You have trouble catching your breath and have chest pain.  You notice sudden or extreme puffiness (swelling) of your face, hands, ankles, feet, or legs.  You have not felt the baby move in over an hour.  You have severe headaches that do not go away when you take medicine.  You have trouble seeing. Summary  The second trimester is from week 14 through week 27 (months 4 through 6). This is often the time in pregnancy that you feel your best.  To take care of yourself and your unborn baby, you will need to eat healthy meals, take medicines only if your doctor tells you to do so, and do activities that are safe for you and your baby.  Call your doctor if you get sick or if you notice anything unusual about your pregnancy. Also, call your doctor if you need help with the right food to eat, or if you want to know what activities are safe for you. This information is not intended to replace advice given to you by your health care provider. Make sure you discuss any questions you have with your health care provider. Document Revised: 12/16/2018 Document Reviewed: 09/29/2016 Elsevier Patient Education  2020 [employer].

## 2020-03-08 ENCOUNTER — Ambulatory Visit (INDEPENDENT_AMBULATORY_CARE_PROVIDER_SITE_OTHER): Payer: Self-pay | Admitting: Family Medicine

## 2020-03-08 ENCOUNTER — Other Ambulatory Visit: Payer: Self-pay | Admitting: Family Medicine

## 2020-03-08 ENCOUNTER — Other Ambulatory Visit: Payer: Self-pay

## 2020-03-08 VITALS — BP 110/70 | HR 92 | Wt 179.4 lb

## 2020-03-08 DIAGNOSIS — O2341 Unspecified infection of urinary tract in pregnancy, first trimester: Secondary | ICD-10-CM

## 2020-03-08 DIAGNOSIS — Z3480 Encounter for supervision of other normal pregnancy, unspecified trimester: Secondary | ICD-10-CM

## 2020-03-08 DIAGNOSIS — Z3A17 17 weeks gestation of pregnancy: Secondary | ICD-10-CM

## 2020-03-08 DIAGNOSIS — B951 Streptococcus, group B, as the cause of diseases classified elsewhere: Secondary | ICD-10-CM

## 2020-03-09 LAB — HCV INTERPRETATION

## 2020-03-09 LAB — HCV AB W REFLEX TO QUANT PCR: HCV Ab: <0.1 s/co ratio (ref 0.0–0.9)

## 2020-03-11 LAB — CULTURE, OB URINE

## 2020-03-11 LAB — URINE CULTURE, OB REFLEX

## 2020-03-12 ENCOUNTER — Other Ambulatory Visit: Payer: Self-pay | Admitting: Family Medicine

## 2020-03-13 ENCOUNTER — Telehealth: Payer: Self-pay | Admitting: Family Medicine

## 2020-03-13 ENCOUNTER — Other Ambulatory Visit: Payer: Self-pay | Admitting: Family Medicine

## 2020-03-13 DIAGNOSIS — O2342 Unspecified infection of urinary tract in pregnancy, second trimester: Secondary | ICD-10-CM

## 2020-03-13 MED ORDER — NITROFURANTOIN MONOHYD MACRO 100 MG PO CAPS: 100.0000 mg | ORAL_CAPSULE | Freq: Two times a day (BID) | ORAL | 0 refills | Status: AC

## 2020-03-13 MED FILL — NITROFURANTOIN MONO-MCR 100: 100 | 7 days supply | Qty: 14 | Fill #0

## 2020-03-13 NOTE — Telephone Encounter (Signed)
LVM using [employer] Mindi Junker #[id number] requesting pt to call office back to inform her of below.Kevin Space Zimmerman Rumple, CMA

## 2020-03-13 NOTE — Telephone Encounter (Signed)
Repeat urine at last visit positive for E Faecalis. Sent in prescription for Macrobid 100 mg BID x 7days.  Repeat urine at next OB visit.  Dana Allan, MD Family Medicine Residency

## 2020-03-19 LAB — AFP TETRA
DIA Mom Value: 0.95
DIA Value (EIA): 132.08 pg/mL
DSR (By Age)    1 IN: 535
DSR (Second Trimester) 1 IN: 9445
Gestational Age: 17.4 WEEKS
MSAFP Mom: 0.86
MSAFP: 31.7 ng/mL
MSHCG Mom: 0.48
MSHCG: 12940 m[IU]/mL
Maternal Age At EDD: 31.9 yr
Osb Risk: 10000
T18 (By Age): 1:2086 {titer}
Test Results:: NEGATIVE
Weight: 179 [lb_av]
uE3 Mom: 0.86
uE3 Value: 1.05 ng/mL

## 2020-03-19 LAB — SPECIMEN STATUS REPORT

## 2020-03-22 ENCOUNTER — Encounter: Payer: Self-pay | Admitting: Family Medicine

## 2020-03-25 ENCOUNTER — Encounter: Payer: Self-pay | Admitting: Family Medicine

## 2020-03-28 NOTE — Progress Notes (Signed)
Kristine Bryant is a 34 y.o. G1P0 at [redacted]w[redacted]d here for routine follow up. She is dated by early ultrasound.  She reports carpal tunnel symptoms, no bleeding, no contractions, no cramping and no leaking. She reports that she did not feel any movement for 2 days last week.  She went for an ultrasound which was reassuring.  She currently is feeling a lot of movement. See flow sheet for details.  A/P: Pregnancy at [redacted]w[redacted]d.  Doing well.    Dating reviewed, dating tab is correct  Fetal heart tones Appropriate  Pregnancy issues include. NO fetal movement for 2 days, no resolved and intermittent tingling in both hands and feet.  Problem list updated: Yes.   Influenza vaccine not administered as not influenza season.    The patient has the following indication for screening preexisting diabetes: Reviewed indications for early 1 hour glucose testing, not indicated .  Anatomy ultrasound ordered to be scheduled at 18-20 weeks and completed last week.  Patient is interested in genetic screening. As she is past 13 weeks and 6 days, a Quad screen  was done.   Pregnancy education including expected weight gain in pregnancy, OTC medication use, continued use of prenatal vitamin, smoking cessation if applicable, and nutrition in pregnancy.    Bleeding and pain precautions reviewed.  Discussed with patient that if not feeling movement for >2hrs go to [facility]  A request was made to obtain ultrasound results from [facility].  Follow up 4 weeks in [facility] OB clinic.

## 2020-03-29 ENCOUNTER — Other Ambulatory Visit: Payer: Self-pay

## 2020-03-29 ENCOUNTER — Ambulatory Visit (INDEPENDENT_AMBULATORY_CARE_PROVIDER_SITE_OTHER): Payer: Self-pay | Admitting: Family Medicine

## 2020-03-29 VITALS — BP 102/68 | HR 103 | Wt 182.0 lb

## 2020-03-29 DIAGNOSIS — Z3480 Encounter for supervision of other normal pregnancy, unspecified trimester: Secondary | ICD-10-CM

## 2020-03-29 NOTE — Patient Instructions (Signed)
Thank you for coming to see me today. It was a pleasure.   talked   If you do not feel baby moving please go to the hospital to be seen.  Please follow-up with me in 4 weeks  If you have any questions or concerns, please do not hesitate to call the office at [phone].  Best,   Dana Allan, MD Family Medicine Residency     Segundo trimestre de embarazo Second Trimester of Pregnancy  El segundo trimestre va desde la semana14 hasta la 27 (desde el mes 4 hasta el 6). Este suele ser el momento en el que mejor se siente. En general, las nuseas matutinas han disminuido o han desaparecido completamente. Tendr ms energa y podr aumentarle el apetito. El beb en gestacin se desarrolla rpidamente. Hacia el final del sexto mes, el beb mide aproximadamente 9 pulgadas (23 cm) y pesa alrededor de 1 libras (700 g). Es probable que sienta al beb [employer] 18 y 20 semanas del [place]. Siga estas indicaciones en su casa: Medicamentos  [employer] de venta libre y los recetados solamente como se lo haya indicado el mdico. Algunos medicamentos son seguros para tomar durante el Psychiatrist y otros no lo son.  Tome vitaminas prenatales que contengan por lo menos (?g) de cido flico.  Si tiene dificultad para mover el intestino (estreimiento), tome un medicamento para ablandar las heces (laxante) si su mdico se lo autoriza. Comida y bebida   Ingiera alimentos saludables de [place] regular.  No coma carne cruda ni quesos sin cocinar.  Si obtiene poca cantidad de calcio de los alimentos que ingiere, consulte a su mdico sobre la posibilidad de tomar un suplemento diario de calcio.  Evite el consumo de alimentos ricos en grasas y azcares, como los alimentos fritos y los dulces.  Si tiene Programme researcher, broadcasting/film/video (nuseas) o devuelve (vomita): ? Ingiera 4 o 5comidas pequeas por Geophysical data processor de 3abundantes. ? Intente comer algunas galletitas  saladas. ? Beba lquidos [employer], en lugar de [employer].  Para evitar el estreimiento: ? Consuma alimentos ricos en fibra, como frutas y verduras frescas, cereales integrales y frijoles. ? Beba suficiente lquido para mantener el pis (orina) claro o de color amarillo plido. Actividad  Haga ejercicios solamente como se lo haya indicado el mdico. Interrumpa la actividad fsica si comienza a tener calambres.  No haga ejercicio si hace demasiado calor, hay demasiada humedad o se encuentra en un lugar de mucha altura (altitud alta).  Evite levantar pesos [employer].  Use zapatos con tacones bajos. Mantenga una buena postura al sentarse y pararse.  Puede continuar teniendo [employer], a menos que el mdico le indique lo contrario. Alivio del dolor y del Dentist  Use un sostn que le brinde buen soporte si sus mamas estn sensibles.  Dese baos de asiento con agua tibia para Engineer, materials o las molestias causadas por las hemorroides. Use una crema para las hemorroides si el mdico la autoriza.  Descanse con las piernas elevadas si tiene calambres o dolor de cintura.  Si desarrolla venas hinchadas y abultadas (vrices) en las piernas: ? Use medias de compresin o medias de descanso como se lo haya indicado el mdico. ? Levante (eleve) los pies durante , 3 o 4veces por Futures trader. ? Limite el consumo de sal en sus alimentos. Cuidado prenatal  Tonga sus preguntas. Llvelas cuando concurra a las visitas prenatales.  Concurra a todas las visitas prenatales como se lo haya  indicado el mdico. Esto es importante. Seguridad  [employer] cinturn de seguridad cuando conduzca.  Haga una lista de los nmeros de telfono de Associate Professor, que [employer] nmeros de telfono de familiares, amigos, [facility] [facility], as como los departamentos de polica y bomberos. Instrucciones generales  Consulte a su mdico sobre los [employer] debe comer o pdale que la  ayude a Clinical research associate a quien pueda aconsejarla si necesita ese servicio.  Consulte a su mdico acerca de dnde se dictan clases prenatales cerca de donde vive. Comience las clases antes del mes 6 de embarazo.  No se d baos de inmersin en agua caliente, baos turcos ni saunas.  No se haga duchas vaginales ni use tampones o toallas higinicas perfumadas.  No mantenga las piernas cruzadas durante [place].  Vaya al dentista si an no lo hizo. Use un cepillo de cerdas suaves para cepillarse los dientes. Psese el hilo dental suavemente.  No fume, no consuma hierbas ni beba alcohol. No tome frmacos que el mdico no haya autorizado.  No consuma ningn producto que contenga nicotina o tabaco, como cigarrillos y Administrator, Civil Service. Si necesita ayuda para dejar de fumar, consulte al mdico.  Evite el contacto con las bandejas sanitarias de los gatos y la tierra que estos animales usan. Estos elementos contienen bacterias que pueden causar defectos congnitos al beb y la posible prdida del beb (aborto espontneo) o la muerte fetal. Comunquese con un mdico si:  Tiene clicos leves o siente presin en la parte baja del vientre.  Tiene dolor al hacer pis (orinar).  Advierte un lquido con olor ftido que proviene de la vagina.  Tiene Programme researcher, broadcasting/film/video (nuseas), devuelve (vomita) o tiene deposiciones acuosas (diarrea).  Sufre un dolor persistente en el abdomen.  Siente mareos. Solicite ayuda de inmediato si:  Tiene fiebre.  Tiene una prdida de lquido por la vagina.  Tiene sangrado o pequeas prdidas vaginales.  Siente dolor intenso o clicos en el abdomen.  Sube o baja de peso rpidamente.  Tiene dificultades para recuperar el aliento y siente dolor en el pecho.  Sbitamente se le hinchan mucho el rostro, las [place], los tobillos, los pies o las piernas.  No ha sentido los movimientos del beb durante Georgianne Fick.  Siente un dolor de cabeza intenso que no se alivia  al tomar [name].  Tiene dificultad para ver. Resumen  El segundo trimestre va desde la semana14 hasta la 27, desde el mes 4 hasta el 6. Este suele ser el momento en el que mejor se siente.  Para cuidarse y cuidar a su beb en gestacin, debe comer alimentos saludables, tomar medicamentos solamente si su mdico le indica que lo haga y hacer actividades que sean seguras para usted y su beb.  Llame al mdico si se enferma o si nota algo inusual acerca de su embarazo. Tambin llame al mdico si necesita ayuda para saber qu alimentos debe comer o si quiere saber qu actividades puede realizar de forma segura. Esta informacin no tiene Theme park manager el consejo del mdico. Asegrese de hacerle al mdico cualquier pregunta que tenga. Document Revised: 05/19/2017 Document Reviewed: 05/19/2017 Elsevier Patient Education  2020 [employer].

## 2020-04-02 ENCOUNTER — Telehealth: Payer: Self-pay | Admitting: *Deleted

## 2020-04-02 NOTE — Telephone Encounter (Signed)
-----   Message from Dana Allan, MD sent at 03/29/2020  1:39 PM EDT ----- Please call patient and reschedule next visit with me to [facility] faculty clinic.    Thank you   Dana Allan, MD Family Medicine Residency

## 2020-04-04 NOTE — Telephone Encounter (Signed)
LVM for pt to call back to inform her that we are going to change her appointment from 8/18 @8 :30 to 8/26@ 10:00am.  She should be here on that day instead. If pt calls back please inform her of below and cancel the appointment on the 18th with [facility].Karyme Mcconathy Zimmerman Rumple, CMA

## 2020-04-12 ENCOUNTER — Telehealth: Payer: Self-pay

## 2020-04-12 NOTE — Telephone Encounter (Signed)
Received phone call from Byrd Hesselbach at [facility] regarding patient. Patient is reporting severe headache that has not gone away with tylenol and vomiting. Denies blurred vision and swelling. No available appointments until Monday afternoon. Due to symptoms advised Byrd Hesselbach to have patient evaluated in [facility].   Veronda Prude, RN

## 2020-04-15 NOTE — Telephone Encounter (Signed)
Please call patient and see if she is still having symptoms.  If so she needs to be seen in clinic.  Thank you  Dana Allan, MD Family Medicine Residency

## 2020-04-16 NOTE — Telephone Encounter (Signed)
Called patient using [employer] [phone]. Patient did not answer, left HIPPA compliant VM for patient to return call to office for follow up.  Veronda Prude, RN

## 2020-04-23 ENCOUNTER — Telehealth: Payer: Self-pay | Admitting: *Deleted

## 2020-04-23 NOTE — Telephone Encounter (Signed)
LVM to call office back so that we can have [place] results faxed to [place] from 03/25/2020 at health dept.  Kristine Bryant, CMA

## 2020-04-23 NOTE — Progress Notes (Addendum)
Kristine Bryant is a 34 y.o. G1P0 at [redacted]w[redacted]d here for routine follow up. She is dated by early ultrasound.  She reports headache, heartburn, no bleeding, no contractions, no cramping and no leaking. See flow sheet for details.  A/P: Pregnancy at [redacted]w[redacted]d.  Doing well.   . Dating reviewed, dating tab is correct . Fetal heart tones Appropriate . Fundal height within expected range.  . Pregnancy issues include None. . Anatomy ultrasound completed and requested results from [facility].  . Problem list updated Yes.  . Influenza vaccine not administered as not influenza season. .  . Indications for screening for preexisting diabetes include: Reviewed indications for early 1 hour glucose testing, not indicated .  Marland Kitchen Pregnancy education provided on the following topics: fetal growth and movement, ultrasound assessment, and upcoming laboratory assessment.   . Initial COVID vaccine given today. Repeat in 4 weeks. . Scheduled for [facility] Clinic during second trimester on Aug 26 at 1000. . GTT, Tdapt at next visit . Preterm labor precautions given.   Follow up 1 weeks.

## 2020-04-24 ENCOUNTER — Other Ambulatory Visit: Payer: Self-pay

## 2020-04-24 ENCOUNTER — Ambulatory Visit (INDEPENDENT_AMBULATORY_CARE_PROVIDER_SITE_OTHER): Payer: Self-pay | Admitting: Family Medicine

## 2020-04-24 VITALS — BP 112/68 | HR 110 | Wt 188.0 lb

## 2020-04-24 DIAGNOSIS — Z3482 Encounter for supervision of other normal pregnancy, second trimester: Secondary | ICD-10-CM

## 2020-04-24 DIAGNOSIS — Z23 Encounter for immunization: Secondary | ICD-10-CM

## 2020-04-24 MED ORDER — FAMOTIDINE 20 MG PO TABS
20.0000 mg | ORAL_TABLET | Freq: Two times a day (BID) | ORAL | 1 refills | Status: DC
Start: 2020-04-24 — End: 2020-07-26

## 2020-04-24 MED FILL — FAMOTIDINE 20 MG TABS: 20 | 30 days supply | Qty: 60 | Fill #0

## 2020-04-24 NOTE — Telephone Encounter (Signed)
Pt will be seen on both 8/18 and 8/26. Sunday Spillers, CMA

## 2020-04-24 NOTE — Patient Instructions (Signed)
For any pregnancy-related emergencies after 5:30am please go to the Maternity Admissions Unit in the [facility] at [facility]. You will use hospital Entrance C.   COVID-19 Vaccine Information can be found at: [email or web address] For questions related to vaccine distribution or appointments, please email [email or web address] or call [phone].   Follow up appointment Aug 26 at 1000

## 2020-05-02 ENCOUNTER — Encounter: Payer: Self-pay | Admitting: Family Medicine

## 2020-05-02 ENCOUNTER — Ambulatory Visit (INDEPENDENT_AMBULATORY_CARE_PROVIDER_SITE_OTHER): Payer: Self-pay | Admitting: Family Medicine

## 2020-05-02 ENCOUNTER — Other Ambulatory Visit: Payer: Self-pay

## 2020-05-02 VITALS — BP 108/68 | HR 100 | Wt 193.0 lb

## 2020-05-02 DIAGNOSIS — Z3480 Encounter for supervision of other normal pregnancy, unspecified trimester: Secondary | ICD-10-CM

## 2020-05-02 DIAGNOSIS — O24410 Gestational diabetes mellitus in pregnancy, diet controlled: Secondary | ICD-10-CM

## 2020-05-02 DIAGNOSIS — R2243 Localized swelling, mass and lump, lower limb, bilateral: Secondary | ICD-10-CM

## 2020-05-02 DIAGNOSIS — O26899 Other specified pregnancy related conditions, unspecified trimester: Secondary | ICD-10-CM

## 2020-05-02 DIAGNOSIS — G5600 Carpal tunnel syndrome, unspecified upper limb: Secondary | ICD-10-CM

## 2020-05-02 LAB — POCT 1 HR PRENATAL GLUCOSE: Glucose 1 Hr Prenatal, POC: 199 mg/dL

## 2020-05-02 NOTE — Progress Notes (Signed)
  [facility] Prenatal Visit  Kristine Bryant is a 34 y.o. G1P0 at [redacted]w[redacted]d here for routine follow up. She is dated by early ultrasound.  She reports bilateral wrist pain mostly at night and lower extremity swelling. She reports fetal movement. Denies vaginal bleeding, loss of fluid, or contractions.  See flow sheet for details.  Elevated 1 GTT  Patient consumes rice, with milk + sugar on regular basis. Mother and sisters 'all have diabetes'. She is not surprised by result   A/P: Pregnancy at [redacted]w[redacted]d.  Doing well.   . Dating reviewed, dating tab is correct . Fetal heart tones Appropriate . Fundal height within expected range.  . Influenza vaccine not administered as not influenza season.   . Screening for gestational diabetes completed today. Value >135, scheduled for 3 hour test. . . Pregnancy education completed including: fetal growth, breastfeeding, contraception, and expected weight gain in pregnancy.   . The patient does not have a history of Cesarean delivery and no referral to Center for [facility] is indicated . Preterm labor, bleeding, and pain precautions given.    2. Pregnancy issues include the following and were addressed as appropriate today:   Carpal tunnel syndrome -Occurring nightly -Bilaterally wrist splints nightly   Lower extremity swelling -Mild -Elevate above heart -Compression stockings -Return precautions of unilateral swelling, warmth and pain given   GBS bacteriuria, E. faecalis -obtain OB urine cx today -will need intrapartum prophylaxis  Gestational Diabetes -By 1 hour of 199 at 25 weeks. Discussed. Patient has glucometer at home. Discussed checking BG four times per day, BG goals, dietary changes and exercise. Discussed potential neonatal complications, referral to [facility]. Number was provided. Referral placed.   . Problem list and pregnancy box updated: Yes.   Follow up 4 weeks 05/31/20 @ 8:30am.  Patient seen and examined  with Dr. Salvadore Dom. Discussed as above.  I discussed the plan of care with the resident physician and agree with below documentation.  Terisa Starr, MD

## 2020-05-02 NOTE — Patient Instructions (Addendum)
It was very nice to meet you today. Please enjoy the rest of your week. Today you were seen for ob visit.  I am glad you are taking prenatal classes.   Continue to take your prenatal vitamin regularly.   - your sugar level was elevated, this means you have the diagnosis of 'gestational diabetes' --we recommend you follow with Obstetrics    - Check your blood sugar every morning before you eat breakfast--ideally <90  - check your blood sugar 2 hours after each meal-- ideally <120   - Keep a log of your sugars   - Reduce simple carbohydrates---complex carbohydrates are still good!  PLEASE CALL TO SCHEDULE YOUR OB Appointment with Center for [facility] [phone]   We talked about wrist swelling which is carpal tunnel in pregnancy. You can use wrist splints to help with this. Try amazon.com or target/walmart.    Your lower leg swelling is mild. Please put your feet up when you are resting. You can try the compression stockings. These can be bought in the pharmacy at [facility] or any local pharmacy. Lets [place] know if you have unequal swelling, warmth, or pain in one leg or the other.   Follow up on 05/31/20 @ 8:30am for your next appointment or sooner if needed.   Please call the clinic at [phone] if you have any concerns. It was our pleasure to serve you.  Segundo trimestre de [employer] Trimester of Pregnancy  El segundo trimestre va desde la semana14 hasta la 27 (desde el mes 4 hasta el 6). Este suele ser el momento en el que mejor se siente. En general, las nuseas matutinas han disminuido o han desaparecido completamente. Tendr ms energa y podr aumentarle el apetito. El beb en gestacin se desarrolla rpidamente. Hacia el final del sexto mes, el beb mide aproximadamente 9 pulgadas (23 cm) y pesa alrededor de 1 libras (700 g). Es probable que sienta al beb [employer] 18 y 20 semanas del [place]. Siga estas indicaciones en su casa: Medicamentos  [employer] de venta libre y los recetados solamente como se lo haya indicado el mdico. Algunos medicamentos son seguros para tomar durante el Psychiatrist y otros no lo son.  Tome vitaminas prenatales que contengan por lo menos (?g) de cido flico.  Si tiene dificultad para mover el intestino (estreimiento), tome un medicamento para ablandar las heces (laxante) si su mdico se lo autoriza. Comida y bebida   Ingiera alimentos saludables de [place] regular.  No coma carne cruda ni quesos sin cocinar.  Si obtiene poca cantidad de calcio de los alimentos que ingiere, consulte a su mdico sobre la posibilidad de tomar un suplemento diario de calcio.  Evite el consumo de alimentos ricos en grasas y azcares, como los alimentos fritos y los dulces.  Si tiene Programme researcher, broadcasting/film/video (nuseas) o devuelve (vomita): ? Ingiera 4 o 5comidas pequeas por Geophysical data processor de 3abundantes. ? Intente comer algunas galletitas saladas. ? Beba lquidos [employer], en lugar de [employer].  Para evitar el estreimiento: ? Consuma alimentos ricos en fibra, como frutas y verduras frescas, cereales integrales y frijoles. ? Beba suficiente lquido para mantener el pis (orina) claro o de color amarillo plido. Actividad  Haga ejercicios solamente como se lo haya indicado el mdico. Interrumpa la actividad fsica si comienza a tener calambres.  No haga ejercicio si hace demasiado calor, hay demasiada humedad o se encuentra en un lugar de mucha altura (altitud alta).  Evite  levantar pesos [employer].  Use zapatos con tacones bajos. Mantenga una buena postura al sentarse y pararse.  Puede continuar teniendo [employer], a menos que el mdico le indique lo contrario. Alivio del dolor y del Dentist  Use un sostn que le brinde buen soporte si sus mamas estn sensibles.  Dese baos de asiento con agua tibia para Engineer, materials o las molestias causadas por las hemorroides.  Use una crema para las hemorroides si el mdico la autoriza.  Descanse con las piernas elevadas si tiene calambres o dolor de cintura.  Si desarrolla venas hinchadas y abultadas (vrices) en las piernas: ? Use medias de compresin o medias de descanso como se lo haya indicado el mdico. ? Levante (eleve) los pies durante , 3 o 4veces por Futures trader. ? Limite el consumo de sal en sus alimentos. Cuidado prenatal  Tonga sus preguntas. Llvelas cuando concurra a las visitas prenatales.  Concurra a todas las visitas prenatales como se lo haya indicado el mdico. Esto es importante. Seguridad  [employer] cinturn de seguridad cuando conduzca.  Haga una lista de los nmeros de telfono de Associate Professor, que [employer] nmeros de telfono de familiares, amigos, [facility], as como los departamentos de polica y bomberos. Instrucciones generales  Consulte a su mdico sobre los [employer] debe comer o pdale que la ayude a Clinical research associate a quien pueda aconsejarla si necesita ese servicio.  Consulte a su mdico acerca de dnde se dictan clases prenatales cerca de donde vive. Comience las clases antes del mes 6 de embarazo.  No se d baos de inmersin en agua caliente, baos turcos ni saunas.  No se haga duchas vaginales ni use tampones o toallas higinicas perfumadas.  No mantenga las piernas cruzadas durante [place].  Vaya al dentista si an no lo hizo. Use un cepillo de cerdas suaves para cepillarse los dientes. Psese el hilo dental suavemente.  No fume, no consuma hierbas ni beba alcohol. No tome frmacos que el mdico no haya autorizado.  No consuma ningn producto que contenga nicotina o tabaco, como cigarrillos y Administrator, Civil Service. Si necesita ayuda para dejar de fumar, consulte al mdico.  Evite el contacto con las bandejas sanitarias de los gatos y la tierra que estos animales usan. Estos elementos contienen bacterias que pueden causar defectos congnitos al beb  y la posible prdida del beb (aborto espontneo) o la muerte fetal. Comunquese con un mdico si:  Tiene clicos leves o siente presin en la parte baja del vientre.  Tiene dolor al hacer pis (orinar).  Advierte un lquido con olor ftido que proviene de la vagina.  Tiene Programme researcher, broadcasting/film/video (nuseas), devuelve (vomita) o tiene deposiciones acuosas (diarrea).  Sufre un dolor persistente en el abdomen.  Siente mareos. Solicite ayuda de inmediato si:  Tiene fiebre.  Tiene una prdida de lquido por la vagina.  Tiene sangrado o pequeas prdidas vaginales.  Siente dolor intenso o clicos en el abdomen.  Sube o baja de peso rpidamente.  Tiene dificultades para recuperar el aliento y siente dolor en el pecho.  Sbitamente se le hinchan mucho el rostro, las [place], los tobillos, los pies o las piernas.  No ha sentido los movimientos del beb durante Georgianne Fick.  Siente un dolor de cabeza intenso que no se alivia al tomar [name].  Tiene dificultad para ver. Resumen  El segundo trimestre va desde la semana14 hasta la 27, desde el mes 4 hasta el 6. Este suele ser el momento en el que mejor se  siente.  Para cuidarse y cuidar a su beb en gestacin, debe comer alimentos saludables, tomar medicamentos solamente si su mdico le indica que lo haga y hacer actividades que sean seguras para usted y su beb.  Llame al mdico si se enferma o si nota algo inusual acerca de su embarazo. Tambin llame al mdico si necesita ayuda para saber qu alimentos debe comer o si quiere saber qu actividades puede realizar de forma segura. Esta informacin no tiene Theme park manager el consejo del mdico. Asegrese de hacerle al mdico cualquier pregunta que tenga. Document Revised: 05/19/2017 Document Reviewed: 05/19/2017 Elsevier Patient Education  2020 [employer].

## 2020-05-03 ENCOUNTER — Telehealth: Payer: Self-pay | Admitting: Family Medicine

## 2020-05-03 DIAGNOSIS — Z3480 Encounter for supervision of other normal pregnancy, unspecified trimester: Secondary | ICD-10-CM

## 2020-05-03 NOTE — Telephone Encounter (Signed)
Received ultrasound.  Ultrasound at [place] used LMP as dating criteria--- by LMP was [redacted]w[redacted]d at time of ultrasound. She is dated by 7 week ultrasound.  Female gender, normal scan but weight at 9.7% (again, misdated by this scan, so growth likely okay). Incomplete views of spine.   Attempted to call patient. If she calls back, please identify a good time to call. Ultrasound overall normal--she needs repeat to get full view of spine.   She is adopt a mom---will need follow up scan ordered. Terisa Starr, MD  Family Medicine Teaching Service

## 2020-05-06 ENCOUNTER — Encounter: Payer: Self-pay | Admitting: Family Medicine

## 2020-05-06 LAB — URINE CULTURE, OB REFLEX

## 2020-05-06 LAB — CULTURE, OB URINE

## 2020-05-06 NOTE — Telephone Encounter (Signed)
Called patient with Spanish interpreter.   If calls back, please identify good time to call patient. To obtain complete spine views and assess fetal growth, needs repeat ultrasound.  Terisa Starr, MD  Family Medicine Teaching Service

## 2020-05-15 ENCOUNTER — Other Ambulatory Visit: Payer: Self-pay

## 2020-05-15 ENCOUNTER — Ambulatory Visit (INDEPENDENT_AMBULATORY_CARE_PROVIDER_SITE_OTHER): Payer: Self-pay

## 2020-05-15 DIAGNOSIS — Z23 Encounter for immunization: Secondary | ICD-10-CM

## 2020-05-15 NOTE — Progress Notes (Signed)
   Covid-19 Vaccination Clinic  Name:  Kristine Bryant    MRN: [id number] DOB: [birth year]  05/15/2020  [name] was observed post Covid-19 immunization for 15 minutes without incident. She was provided with Vaccine Information Sheet and instruction to access the V-Safe system.   [name] was instructed to call 911 with any severe reactions post vaccine: Marland Kitchen Difficulty breathing  . Swelling of face and throat  . A fast heartbeat  . A bad rash all over body  . Dizziness and weakness   #2 Covid Vaccine administered LD without complication.

## 2020-05-31 ENCOUNTER — Ambulatory Visit (INDEPENDENT_AMBULATORY_CARE_PROVIDER_SITE_OTHER): Payer: Self-pay | Admitting: Family Medicine

## 2020-05-31 ENCOUNTER — Other Ambulatory Visit: Payer: Self-pay

## 2020-05-31 VITALS — BP 98/62 | HR 102 | Wt 193.0 lb

## 2020-05-31 DIAGNOSIS — O0993 Supervision of high risk pregnancy, unspecified, third trimester: Secondary | ICD-10-CM

## 2020-05-31 DIAGNOSIS — Z3480 Encounter for supervision of other normal pregnancy, unspecified trimester: Secondary | ICD-10-CM

## 2020-05-31 MED ORDER — TETANUS-DIPHTH-ACELL PERTUSSIS 5-2.5-18.5 LF-MCG/0.5 IM SUSP: 0.5000 mL | Freq: Once | INTRAMUSCULAR | 0 refills | Status: AC

## 2020-05-31 NOTE — Patient Instructions (Signed)
Sndrome del tnel carpiano Carpal Tunnel Syndrome  El sndrome del tnel carpiano es una afeccin que causa dolor en la mano y en el brazo. El tnel carpiano es un rea estrecha que se encuentra en el lado palmar de la mueca. Los movimientos repetidos de la mueca o determinadas enfermedades pueden causar hinchazn en el tnel. Esta hinchazn puede comprimir el nervio principal de la mueca (nervio mediano). Cules son las causas? Esta afeccin puede ser causada por lo siguiente:  Movimientos repetidos de la mueca.  Lesiones en la mueca.  Artritis.  Un saco lleno de lquido (quiste) o un crecimiento anormal (tumor) en el tnel carpiano.  Acumulacin de lquido durante el embarazo. Algunas veces, la causa no se conoce. Qu incrementa el riesgo? Los siguientes factores pueden hacer que sea ms propenso a contraer esta afeccin:  Tener un trabajo que exige mover la mueca del mismo modo muchas veces. Esto incluye trabajos como el de carnicero o el de cajero.  Ser mujer.  Tener otras afecciones, por ejemplo: ? Diabetes. ? Obesidad. ? Una glndula tiroidea que no es lo suficientemente activa (hipotiroidismo). ? Insuficiencia renal. Cules son los signos o sntomas? Los sntomas de esta afeccin incluyen:  Sensacin de hormigueo en los dedos.  Hormigueo o prdida de la sensibilidad (adormecimiento) en la mano.  Dolor en todo el brazo. Este dolor puede empeorar al flexionar la mueca y el codo durante mucho tiempo.  Dolor en la mueca que sube por el brazo hasta el hombro.  Dolor que baja hasta la palma de la mano o los dedos.  Sensacin de debilidad en las manos. Puede resultarle difcil tomar y sostener objetos. Es posible que se sienta peor por la noche. Cmo se diagnostica? Esta afeccin se diagnostica mediante los antecedentes mdicos y un examen fsico. Tambin pueden hacerle estudios, por ejemplo:  Una electromiograma (EMG). Este estudio comprueba las seales  que los nervios envan a los msculos.  Estudio de conduccin nerviosa. Este estudio comprueba si las seales pasan correctamente por los nervios.  Estudios de diagnstico por imgenes, como radiografas, una ecografa y una resonancia magntica (RM). Estos estudios permiten detectar cul podra ser la causa de su afeccin. Cmo se trata? El tratamiento de esta afeccin puede incluir:  Cambios en el estilo de vida. Se le pedir que deje o cambie la actividad que caus el problema.  Hacer ejercicio y actividades para que los msculos y los huesos se vuelvan ms fuertes (fisioterapia).  Aprender a usar la mano nuevamente (terapia ocupacional).  Medicamentos para tratar el dolor y la hinchazn (inflamacin). Es posible que le apliquen inyecciones en la mueca.  Una frula para la mueca.  Una ciruga. Siga estas indicaciones en su casa: Si tiene una frula:  Use la frula como se lo haya indicado el mdico. Quteselo solamente como se lo haya indicado el mdico.  Afloje la frula si los dedos: ? Hormiguean. ? Pierden la sensibilidad (se adormecen). ? Se tornan fros y de color azul.  Mantenga la frula limpia.  Si la frula no es impermeable: ? No deje que se moje. ? Cbralo con un envoltorio hermtico cuando tome un bao de inmersin o una ducha. Control del dolor, la rigidez y la hinchazn   Si se lo indican, aplique hielo sobre la zona dolorida: ? Si tiene una frula desmontable, qutesela como se lo haya indicado el mdico. ? Ponga el hielo en una bolsa plstica. ? Coloque una toalla entre la piel y la bolsa. ? Coloque el hielo   durante 20minutos, 2a3veces al da. Indicaciones generales  Tome los medicamentos de venta libre y los recetados solamente como se lo haya indicado el mdico.  Descanse la mueca de cualquier actividad que le cause dolor. Si es necesario, hable con su jefe en el trabajo sobre los cambios que pueden ayudar a la curacin de la mueca.  Haga  los ejercicios como se lo hayan indicado el mdico, el fisioterapeuta o el terapeuta ocupacional.  Concurra a todas las visitas de seguimiento como se lo haya indicado el mdico. Esto es importante. Comunquese con un mdico si:  Aparecen nuevos sntomas.  Los medicamentos no le alivian el dolor.  Sus sntomas empeoran. Solicite ayuda de inmediato si:  Tiene adormecimiento u hormigueo muy intensos en la mueca o la mano. Resumen  El sndrome del tnel carpiano es una afeccin que causa dolor en la mano y en el brazo.  Suele deberse a movimientos repetidos de la mueca.  Este problema se trata mediante cambios en el estilo de vida y medicamentos. La ciruga puede ser necesaria en casos muy graves.  Siga las indicaciones del mdico sobre el uso de una frula, el reposo de la mueca, la asistencia a las consultas de control y llamar para pedir ayuda. Esta informacin no tiene como fin reemplazar el consejo del mdico. Asegrese de hacerle al mdico cualquier pregunta que tenga. Document Revised: 01/27/2018 Document Reviewed: 01/27/2018 Elsevier Patient Education  2020 Elsevier Inc.  

## 2020-05-31 NOTE — Progress Notes (Signed)
  [facility] Prenatal Visit  Kristine Bryant is a 34 y.o. G1P0 at [redacted]w[redacted]d here for routine follow up. She is dated by early ultrasound.  She reports no complaints. She reports fetal movement. She denies vaginal bleeding, contractions, or loss of fluid. See flow sheet for details.  Vitals:   05/31/20 0833  BP: 98/62  Pulse: (!) 102    A/P: Pregnancy at [redacted]w[redacted]d.  Doing well.   1. Routine prenatal care:  Marland Kitchen Dating reviewed, dating tab is correct . Fetal heart tones Appropriate . Fundal height within expected range.  . Infant feeding choice: Breastfeeding . Contraception choice: Undecided  . Infant circumcision desired not applicable  . The patient does not have a history of Cesarean delivery and no referral to Center for [facility] is indicated . Influenza vaccine not administered today. . Tdap was not given today. RX printed to take to HD.  . CBC, RPR, and HIV were obtained today.    . Rh status was reviewed and patient does not need Rhogam.  Rhogam was not given today.  . Pregnancy medical home and PHQ-9 forms were done today and reviewed.   . Childbirth and education classes were not offered. Patient is already taking classes. . Pregnancy education regarding benefits of breastfeeding, contraception, fetal growth, expected weight gain, and safe infant sleep were discussed.  . Preterm labor and fetal movement precautions reviewed.  2. Pregnancy issues include the following and were addressed as appropriate today:   HROB/A1GDM -Gestational diabetes; failed early third trimester 199   -Referred to OBGYN, appt 06/12/20  -CBGs avg. 140, range 79-245   Carpal tunnel syndrome  -Continue hand splints; consider wearing during the day  . Problem list and pregnancy box updated: Yes.

## 2020-06-01 LAB — RPR: RPR Ser Ql: NONREACTIVE

## 2020-06-01 LAB — CBC
Hematocrit: 38.3 % (ref 34.0–46.6)
Hemoglobin: 12.4 g/dL (ref 11.1–15.9)
MCH: 29 pg (ref 26.6–33.0)
MCHC: 32.4 g/dL (ref 31.5–35.7)
MCV: 90 fL (ref 79–97)
Platelets: 254 10*3/uL (ref 150–450)
RBC: 4.27 x10E6/uL (ref 3.77–5.28)
RDW: 12.8 % (ref 11.7–15.4)
WBC: 11.7 10*3/uL — ABNORMAL HIGH (ref 3.4–10.8)

## 2020-06-01 LAB — HIV ANTIBODY (ROUTINE TESTING W REFLEX): HIV Screen 4th Generation wRfx: NONREACTIVE

## 2020-06-03 ENCOUNTER — Encounter: Payer: Self-pay | Admitting: Family Medicine

## 2020-06-12 ENCOUNTER — Encounter: Payer: Self-pay | Admitting: Obstetrics and Gynecology

## 2020-06-12 ENCOUNTER — Other Ambulatory Visit: Payer: Self-pay

## 2020-06-12 ENCOUNTER — Ambulatory Visit (INDEPENDENT_AMBULATORY_CARE_PROVIDER_SITE_OTHER): Payer: Self-pay | Admitting: Obstetrics and Gynecology

## 2020-06-12 VITALS — BP 131/87 | HR 101 | Wt 199.6 lb

## 2020-06-12 DIAGNOSIS — Z789 Other specified health status: Secondary | ICD-10-CM

## 2020-06-12 DIAGNOSIS — Z8632 Personal history of gestational diabetes: Secondary | ICD-10-CM | POA: Insufficient documentation

## 2020-06-12 DIAGNOSIS — O24410 Gestational diabetes mellitus in pregnancy, diet controlled: Secondary | ICD-10-CM

## 2020-06-12 DIAGNOSIS — Z3A31 31 weeks gestation of pregnancy: Secondary | ICD-10-CM

## 2020-06-12 DIAGNOSIS — Z3480 Encounter for supervision of other normal pregnancy, unspecified trimester: Secondary | ICD-10-CM

## 2020-06-12 LAB — POCT URINALYSIS DIP (DEVICE)
Bilirubin Urine: NEGATIVE
Glucose, UA: 250 mg/dL — AB
Leukocytes,Ua: NEGATIVE
Nitrite: NEGATIVE
Protein, ur: NEGATIVE mg/dL
Specific Gravity, Urine: 1.025 (ref 1.005–1.030)
Urobilinogen, UA: 0.2 mg/dL (ref 0.0–1.0)
pH: 6.5 (ref 5.0–8.0)

## 2020-06-12 NOTE — Progress Notes (Addendum)
INITIAL PRENATAL VISIT NOTE  Subjective:  Kristine Bryant is a 34 y.o. G1P0000 at [redacted]w[redacted]d by [place] being seen today for her initial prenatal visit. She has an obstetric history significant for n/a. She has a medical history significant for n/a.  Patient reports swelling in legs.  Contractions: Not present. Vag. Bleeding: None.  Movement: Present. Denies leaking of fluid.   History reviewed. No pertinent past medical history.  Past Surgical History:  Procedure Laterality Date  . APPENDECTOMY     OB History  Gravida Para Term Preterm AB Living  1 0 0 0 0 0  SAB TAB Ectopic Multiple Live Births               # Outcome Date GA Lbr Len/2nd Weight Sex Delivery Anes PTL Lv  1 Current             Social History   Socioeconomic History  . Marital status: Single    Spouse name: Not on file  . Number of children: Not on file  . Years of education: Not on file  . Highest education level: Not on file  Occupational History  . Not on file  Tobacco Use  . Smoking status: Never Smoker  . Smokeless tobacco: Never Used  Vaping Use  . Vaping Use: Never used  Substance and Sexual Activity  . Alcohol use: Not on file    Comment: SOCIAL- LITTLE  . Drug use: Never  . Sexual activity: Yes  Other Topics Concern  . Not on file  Social History Narrative   Moved from [place] in 2021    Social Determinants of Health   Financial Resource Strain:   . Difficulty of Paying Living Expenses: Not on file  Food Insecurity:   . Worried About Programme researcher, broadcasting/film/video in the Last Year: Not on file  . Ran Out of Food in the Last Year: Not on file  Transportation Needs:   . Lack of Transportation (Medical): Not on file  . Lack of Transportation (Non-Medical): Not on file  Physical Activity:   . Days of Exercise per Week: Not on file  . Minutes of Exercise per Session: Not on file  Stress:   . Feeling of Stress : Not on file  Social Connections:   . Frequency of Communication with Friends and  Family: Not on file  . Frequency of Social Gatherings with Friends and Family: Not on file  . Attends Religious Services: Not on file  . Active Member of Clubs or Organizations: Not on file  . Attends Banker Meetings: Not on file  . Marital Status: Not on file    History reviewed. No pertinent family history.   Current Outpatient Medications:  .  famotidine (PEPCID) 20 MG tablet, Take 1 tablet (20 mg total) by mouth 2 (two) times daily., Disp: 60 tablet, Rfl: 1 .  Prenatal Vit-Fe Fumarate-FA (PRENATAL MULTIVITAMIN) TABS tablet, Take 1 tablet by mouth daily at 12 noon., Disp: , Rfl:  .  Doxylamine-Pyridoxine 10-10 MG TBEC, Take 1 pill at night for 3 days then increase to 2 pills at night for 3 days then can increase to 2 pills in the AM and 2 pills in PM (Patient not taking: Reported on 06/12/2020), Disp: 90 tablet, Rfl: 3  No Known Allergies  Review of Systems: Negative except for what is mentioned in HPI.  Objective:   Vitals:   06/12/20 0913  BP: 131/87  Pulse: (!) 101  Weight: 199 lb  9.6 oz (90.5 kg)    Fetal Status: Fetal Heart Rate (bpm): 145 Fundal Height: 32 cm Movement: Present     Physical Exam: BP 131/87   Pulse (!) 101   Wt 199 lb 9.6 oz (90.5 kg)   LMP 10/27/2019   BMI 35.36 kg/m  CONSTITUTIONAL: Well-developed, well-nourished female in no acute distress.  NEUROLOGIC: Alert and oriented to person, place, and time. Normal reflexes, muscle tone coordination. No cranial nerve deficit noted. PSYCHIATRIC: Normal mood and affect. Normal behavior. Normal judgment and thought content. SKIN: Skin is warm and dry. No rash noted. Not diaphoretic. No erythema. No pallor. HENT:  Normocephalic, atraumatic, External right and left ear normal. Oropharynx is clear and moist EYES: Conjunctivae and EOM are normal. Pupils are equal, round, and reactive to light. No scleral icterus.  NECK: Normal range of motion, supple, no masses CARDIOVASCULAR: Normal heart rate  noted, regular rhythm RESPIRATORY: Effort and breath sounds normal, no problems with respiration noted BREASTS: deferred ABDOMEN: Soft, nontender, nondistended, gravid. GU: deferred MUSCULOSKELETAL: Normal range of motion. EXT:  No edema and no tenderness. 2+ distal pulses.   Assessment and Plan:  Pregnancy: G1P0000 at [redacted]w[redacted]d by [place]  1. Supervision of other normal pregnancy, antepartum - Anatomy scan done at [place], 9th%tile, spine not well seen - None since - Anatomy ordered  2. Diet controlled gestational diabetes mellitus (GDM) in third trimester FG: 79-118, mostly 80s-90s PP: 90-140s - Referral to Nutrition and Diabetes Services - reviewed course, importance of controlling diet, poor outcomes if diet not well controlled, she verbalizes understanding  3. Language barrier Engineer, structural used  4. [redacted] weeks gestation of pregnancy    Preterm labor symptoms and general obstetric precautions including but not limited to vaginal bleeding, contractions, leaking of fluid and fetal movement were reviewed in detail with the patient.  Please refer to After Visit Summary for other counseling recommendations.   Return in about 2 weeks (around 06/26/2020) for in person, high OB.  [name] 06/12/2020 10:44 AM

## 2020-06-13 ENCOUNTER — Ambulatory Visit: Payer: Self-pay | Admitting: Registered"

## 2020-06-13 ENCOUNTER — Encounter: Payer: Self-pay | Attending: Obstetrics and Gynecology | Admitting: Registered"

## 2020-06-13 DIAGNOSIS — O24410 Gestational diabetes mellitus in pregnancy, diet controlled: Secondary | ICD-10-CM | POA: Insufficient documentation

## 2020-06-13 DIAGNOSIS — Z3A00 Weeks of gestation of pregnancy not specified: Secondary | ICD-10-CM | POA: Insufficient documentation

## 2020-06-13 DIAGNOSIS — O24419 Gestational diabetes mellitus in pregnancy, unspecified control: Secondary | ICD-10-CM

## 2020-06-13 NOTE — Progress Notes (Signed)
Video Interpreter services provided by Christy Sartorius [phone] from [employer]  Patient was seen on 06/13/20 for Gestational Diabetes self-management. EDD 08/13/20. Patient states no history of GDM. Diet history obtained. Patient eats variety of all food groups. Beverages include water, juice.    The following learning objectives were met by the patient :   States the definition of Gestational Diabetes  States why dietary management is important in controlling blood glucose  Describes the effects of carbohydrates on blood glucose levels  Demonstrates ability to create a balanced meal plan  Demonstrates carbohydrate counting   States when to check blood glucose levels  Demonstrates proper blood glucose monitoring techniques  States the effect of stress and exercise on blood glucose levels  States the importance of limiting caffeine and abstaining from alcohol and smoking  Plan:  Aim for 3 Carbohydrate Choices per meal (45 grams) +/- 1 either way  Aim for 1-2 Carbohydrate Choices per snack Begin reading food labels for Total Carbohydrate of foods If OK with your MD, consider  increasing your activity level by walking, Arm Chair Exercises or other activity daily as tolerated Begin checking Blood Glucose before breakfast and 2 hours after first bite of breakfast, lunch and dinner as directed by MD  Bring Log Book/Sheet and meter to every medical appointment  Baby Scripts: Patient was introduced to [employer] and  plans to use as record of blood glucose electronically. RD set up Spanish version Take medication if directed by MD  Blood glucose monitor given: [employer] Lot # [id number] CBG: 89 mg/dL  Patient had been borrowing a meter, is testing pre breakfast and 2 hours after each meal. Review of Log Book shows: 80% or more above target; 96 - 200s mg/dL  Patient instructed to monitor glucose levels: FBS: 60 - 95 mg/dl 2 hour: <120 mg/dl  Patient received the following handouts:  Nutrition  Diabetes and Pregnancy  Carbohydrate Counting List  Blood glucose Log Sheet  Patient will be seen for follow-up in 1 weeks or as needed.

## 2020-06-18 ENCOUNTER — Other Ambulatory Visit: Payer: Self-pay

## 2020-06-21 ENCOUNTER — Ambulatory Visit: Payer: Self-pay | Admitting: *Deleted

## 2020-06-21 ENCOUNTER — Other Ambulatory Visit: Payer: Self-pay

## 2020-06-21 ENCOUNTER — Ambulatory Visit: Payer: Self-pay | Attending: Obstetrics and Gynecology

## 2020-06-21 ENCOUNTER — Encounter: Payer: Self-pay | Admitting: *Deleted

## 2020-06-21 ENCOUNTER — Other Ambulatory Visit: Payer: Self-pay | Admitting: *Deleted

## 2020-06-21 DIAGNOSIS — O24410 Gestational diabetes mellitus in pregnancy, diet controlled: Secondary | ICD-10-CM

## 2020-06-21 DIAGNOSIS — Z363 Encounter for antenatal screening for malformations: Secondary | ICD-10-CM

## 2020-06-21 DIAGNOSIS — Z362 Encounter for other antenatal screening follow-up: Secondary | ICD-10-CM | POA: Insufficient documentation

## 2020-06-21 DIAGNOSIS — Z3A32 32 weeks gestation of pregnancy: Secondary | ICD-10-CM

## 2020-06-26 ENCOUNTER — Ambulatory Visit (INDEPENDENT_AMBULATORY_CARE_PROVIDER_SITE_OTHER): Payer: Self-pay | Admitting: Obstetrics and Gynecology

## 2020-06-26 ENCOUNTER — Encounter: Payer: Self-pay | Admitting: Obstetrics and Gynecology

## 2020-06-26 ENCOUNTER — Other Ambulatory Visit: Payer: Self-pay

## 2020-06-26 VITALS — BP 111/75 | HR 102 | Wt 199.1 lb

## 2020-06-26 DIAGNOSIS — Z758 Other problems related to medical facilities and other health care: Secondary | ICD-10-CM

## 2020-06-26 DIAGNOSIS — O2343 Unspecified infection of urinary tract in pregnancy, third trimester: Secondary | ICD-10-CM

## 2020-06-26 DIAGNOSIS — Z789 Other specified health status: Secondary | ICD-10-CM

## 2020-06-26 DIAGNOSIS — Z3480 Encounter for supervision of other normal pregnancy, unspecified trimester: Secondary | ICD-10-CM

## 2020-06-26 DIAGNOSIS — B951 Streptococcus, group B, as the cause of diseases classified elsewhere: Secondary | ICD-10-CM

## 2020-06-26 DIAGNOSIS — O24419 Gestational diabetes mellitus in pregnancy, unspecified control: Secondary | ICD-10-CM

## 2020-06-26 LAB — POCT URINALYSIS DIP (DEVICE)
Bilirubin Urine: NEGATIVE
Glucose, UA: 500 mg/dL — AB
Hgb urine dipstick: NEGATIVE
Ketones, ur: NEGATIVE mg/dL
Leukocytes,Ua: NEGATIVE
Nitrite: NEGATIVE
Protein, ur: NEGATIVE mg/dL
Specific Gravity, Urine: >=1.03 (ref 1.005–1.030)
Urobilinogen, UA: 0.2 mg/dL (ref 0.0–1.0)
pH: 6 (ref 5.0–8.0)

## 2020-06-26 NOTE — Patient Instructions (Signed)
AREA PEDIATRIC/FAMILY PRACTICE PHYSICIANS  [place] ([address]) . [facility] o Chambliss, MD; Eniola, MD; Hale, MD; Hensel, MD; McDiarmid, MD; McIntyer, MD; Neal, MD; Walden, MD o [address] [place], [place] [address] o [phone] o Mon-Fri 8:30-12:30, 1:30-5:00 o Providers come to see babies at [facility] o Accepting Medicaid . [facility] o Limited providers who accept newborns: Koirala, MD; Morrow, MD; Wolters, MD o [address], [place], [place] [address] o [phone] o Mon-Fri 8:00-5:30 o Babies seen by providers at [facility] o Does NOT accept Medicaid o Please call early in hospitalization for appointment (limited availability)  . [facility] o Mulberry, MD o [address] [place], [place] [address] o [phone] o Mon, Tue, Thur, Fri 8:30-5:00, Wed 10:00-7:00 (closed 1-2pm) o Babies seen by [facility] providers o Accepting Medicaid . [facility] o Rubin, MD o [address], [place], [place] [address] o [phone] o Mon-Fri 8:30-5:00, Sat 8:30-12:00 o Provider comes to see babies at [facility] o Accepting Medicaid o Must have been referred from current patients or contacted office prior to delivery . [facility] ([facility]) o Brown, MD; Chandler, MD; Ettefagh, MD; Grant, MD; Lester, MD; McCormick, MD; McQueen, MD; Prose, MD; Simha, MD; Stanley, MD; Stryffeler, NP; Tebben, NP o [address], [place], [place] [address] o [phone] o Mon, Tue, Thur, Fri 8:30-5:30, Wed 9:30-5:30, Sat 8:30-12:30 o Babies seen by [facility] providers o Accepting Medicaid o Only accepting infants of first-time parents or siblings of current patients o Hospital discharge coordinator will make follow-up appointment . Jack Amos o [address],  [place], [place]  [address] o [phone]   Fax - [phone] . [facility] o [address], [place], [place]  [address] o Phone - [phone]   Fax - [phone] . Shilpa Gosrani o [address], [place], [place]  [address] o [phone]  [place] ([address]) . [facility] o Bates, MD; [place], MD; Cooper, Cox, MD; MD; Davis, MD; Dovico, MD; Ettefaugh, MD; Little, MD; Lowe, MD; Keiffer, MD; Melvin, MD; Sumner, MD; Williams, MD o [address], [place], [place] [address] o [phone] o Mon-Fri 8:30-5:00 (extended evenings Mon-Thur as needed), Sat-Sun 10:00-1:00 o Providers come to see babies at [facility] o Accepting Medicaid for families of first-time babies and families with all children in the household age 3 and under. Must register with office prior to making appointment (M-F only). . [facility] o Henson, NP; Knapp, MD; Lalonde, MD; Tysinger, PA o [address] [place], [place] [address] o [phone] o Mon-Fri 8:00-5:00 o Babies seen by providers at [facility] o Does NOT accept Medicaid/Commercial Insurance Only . [facility] ([facility]  o Artis, MD; Barnes, MD; Bratton, MD; Coccaro, MD; Lockett Gardner, MD; Kramer, MD; Marshall, MD; Netherton, MD; Poleto, MD; Skinner, MD o [address] [place], [place] [address] o [phone] o Mon-Fri 8:30-5:30, Sat (Oct.-Mar.) 9:00-1:00 o Babies seen by providers at [facility] o Accepting Medicaid  [place] ([address]) . [facility] o Reid, MD; Warner, MD o [address], , [place] [address] o [phone] o Mon-Fri 8:30-5:00, Sat 8:30-12:00 o Providers come to see babies at [facility] o Does NOT accept Medicaid . [facility] o Becker, PA; Hagler, MD; Scifres, PA; Sun, MD; Swayne, MD o [address],  [place], [place] [address] o [phone] o Mon-Fri 8:00-5:00 o Babies seen by providers at [facility] o Does NOT accept Medicaid o Only accepting babies of parents who are patients o Please call early in hospitalization for appointment (limited availability) . [facility] o Clark, MD; Frye, MD; Kelleher, MD; Mack, NP; Miller, MD; O'Keller, MD; Patterson, NP; Pudlo, MD; Puzio, MD; Thomas, MD; Tucker, MD; Twiselton, MD o [address], [place], [place] [address] o [phone] o Mon-Fri 8:00-5:00, Sat 9:00-12:00 o Providers come to see babies at [facility] o Does NOT accept Medicaid  [place] ([address]) . [facility] o Limited providers accepting new patients: Brake, NP; Wharton, PA o [address], [place], [place] [address] o [phone] o Mon-Fri 8:00-5:00 o Babies seen by providers at [facility] o Does NOT accept Medicaid o Only accepting babies of parents who are patients o Please call early in hospitalization for appointment (limited availability) . [facility] o Gay, MD; Quinlan, MD o [address] [place], [place] [address] o [phone] (press 1 to schedule appointment) o Mon-Fri 8:00-5:00 o Providers come to see babies at [facility] o Does NOT accept Medicaid . [facility] o Mazer, MD o [address] [place], [place] [address] o [phone] o Mon-Fri 8:30-5:00 (lunch 12:30-1:00), extended hours by appointment only Wed 5:00-6:30 o Babies seen by [facility] providers o Accepting Medicaid . [facility] o Banks, MD; Jordan, MD; Koberlein, MD o [address], [place], [place] [address] o [phone] o Mon-Fri 8:00-5:00 o Babies seen by [facility] providers o Does NOT accept Medicaid . [facility] o Parker, MD; Hunter, MD; Wallace, DO o [address] [place], [place]  [address] o [phone] o Mon-Fri 8:00-5:00 o Babies seen by [facility] providers o Does NOT accept Medicaid . [facility] o Brandon, PA; Brecken, PA; Christy, NP; Dees, MD; DeClaire, MD; DeWeese, MD; Hansen, NP; Mills, NP; Parrish, NP; Smoot, NP; Summer, MD; Vapne, MD o [address] [place], [place] [address] o [phone] o Mon-Fri 8:30-5:00, Sat 10:00-1:00 o Providers come to see babies at [facility] o Does NOT accept Medicaid o Free prenatal information session Tuesdays at 4:45pm . [facility] o Bouska, MD; Gordon, PA; Jeffery, PA; Weber, PA o [address] [place] [address] o [phone] o Mon-Fri 7:30-5:30 o Babies seen by [facility] providers . [facility] o [address], [place], [place]  [address] o [phone]   Fax - [phone]  [place] ([address] & [address]) . [facility] o Reese, MD o [address] [place], [place] [address] o [phone] o Mon-Thur 8:00-6:00 o Providers come to see babies at [facility] o Accepting Medicaid . [facility] o Anderson, NP; Badger, MD; Beal, PA; Spencer, PA o [address] [place], [place] [address] o [phone] o Mon-Thur 7:30-7:30, Fri 7:30-4:30 o Babies seen by [facility] providers o Accepting Medicaid . [facility] o Agbuya, MD; Klett, NP; Romgoolam, MD o [address], [place],  [address] o [phone] o Mon-Fri 8:30-5:00, Sat 8:30-12:00 o Providers come to see babies at [facility] o Accepting Medicaid o Must have "Meet & Greet" appointment at office prior to delivery . [facility] ([facility] o McCord,   MD; Wallace, MD; Wood, MD o [address], [place], [place] [address] o [phone] o Mon-Wed 8:00-6:00, Thur-Fri 8:00-5:00, Sat 9:00-12:00 o Providers come to  see babies at [facility] o Does NOT accept Medicaid o Only accepting siblings of current patients . [facility]  o [address], [place], [place]  [address] o [phone]   Fax - [phone] . [facility] o [address], [place], [place]  [address] o [phone]   Fax - [phone]  [place] ([address] & [address]) . [facility] o Cirigliano, DO; Matthews, DO o [address] [place], [place] [address] o [phone] o Mon-Fri 7:00-5:00 o Babies seen by [facility] providers o Does NOT accept Medicaid . [facility] o Briscoe, MD; Howley, PA; Moreira, PA o [address], [place], [place] [address] o [phone] o Mon-Fri 8:00-5:00 o Babies seen by [facility] providers o Accepting Medicaid . [facility] o Boyd, MD; Church, PA; Jones, NP; Osborn, PA o [address], [place], [place] [address] o [phone] o Mon-Fri 8:00-5:00 o Babies seen by providers at [facility] o Accepting Medicaid   

## 2020-06-26 NOTE — Progress Notes (Signed)
   PRENATAL VISIT NOTE  Subjective:  Kristine Bryant is a 34 y.o. G1P0000 at [redacted]w[redacted]d being seen today for ongoing prenatal care.  She is currently monitored for the following issues for this high-risk pregnancy and has Supervision of other normal pregnancy, antepartum; Group B streptococcus urinary tract infection affecting pregnancy; Carpal tunnel syndrome during pregnancy; Localized swelling of both lower legs; Gestational diabetes; and Language barrier on their problem list.  Patient reports intermittent contractions, every 15-20 min.  Contractions: Irregular. Vag. Bleeding: None.  Movement: Present. Denies leaking of fluid.   The following portions of the patient's history were reviewed and updated as appropriate: allergies, current medications, past family history, past medical history, past social history, past surgical history and problem list.   Objective:   Vitals:   06/26/20 1041  BP: 111/75  Pulse: (!) 102  Weight: 199 lb 1.6 oz (90.3 kg)    Fetal Status: Fetal Heart Rate (bpm): 152   Movement: Present     General:  Alert, oriented and cooperative. Patient is in no acute distress.  Skin: Skin is warm and dry. No rash noted.   Cardiovascular: Normal heart rate noted  Respiratory: Normal respiratory effort, no problems with respiration noted  Abdomen: Soft, gravid, appropriate for gestational age.  Pain/Pressure: Present     Pelvic: Cervical exam deferred        Extremities: Normal range of motion.  Edema: Trace  Mental Status: Normal mood and affect. Normal behavior. Normal judgment and thought content.   Assessment and Plan:  Pregnancy: G1P0000 at [redacted]w[redacted]d  1. Supervision of other normal pregnancy, antepartum Encouraged her to drink lots of water for contractions  2. Group B Streptococcus urinary tract infection affecting pregnancy in third trimester ppx in labor  3. Gestational diabetes mellitus (GDM), antepartum, gestational diabetes method of control  unspecified FG all within range After breakfast/lunch are within range Post dinner, she has about half that are elevated, reviewed diet and encouraged her to improve dinner meals to avoid the elevated CBGs, she is agreeable  4. Language barrier Engineer, structural used  Preterm labor symptoms and general obstetric precautions including but not limited to vaginal bleeding, contractions, leaking of fluid and fetal movement were reviewed in detail with the patient. Please refer to After Visit Summary for other counseling recommendations.   Return in about 2 weeks (around 07/10/2020) for high OB, in person.  Future Appointments  Date Time Provider Department Center  06/27/2020 10:15 AM [facility]  07/19/2020  8:30 AM [facility] NURSE [facility]  07/19/2020  8:45 AM [facility] US4 [facility]    [name], MD

## 2020-06-27 ENCOUNTER — Ambulatory Visit: Payer: Self-pay | Admitting: Registered"

## 2020-06-27 ENCOUNTER — Other Ambulatory Visit: Payer: Self-pay

## 2020-06-27 ENCOUNTER — Encounter: Payer: Self-pay | Admitting: Registered"

## 2020-06-27 DIAGNOSIS — O24419 Gestational diabetes mellitus in pregnancy, unspecified control: Secondary | ICD-10-CM

## 2020-06-27 NOTE — Progress Notes (Signed)
[facility] Video Interpreter services provided by [facility] #[id number] and second half of appointment [facility] #[id number]  Patient was seen on 06/27/20 for follow-up assessment and education for Gestational Diabetes. EDD 12/27/19.   Patient states she will do whatever it takes to control her blood sugar without medication. Patient provided pictures of her meals which look very healthy with a good variety of vegetables. Patient states she is very hungry soon after meals, likely due to high non-starchy vegetable content and minimal carb. Patient states she snacks on fruit and yogurt. The drinkable yogurt she has been using is 24 g/car (11 g added sugar) and 5 g protein. Pt brought this to last appointment and it seemed okay, but this visit she states she may drink one before dinner and after dinner. This may be causing some of her high readings.  On 10/20 patient states she was having contractions and very worried until after her doctor's appointment. This likely had a role in her elevated CBG of 164 mg/dL  Patient is testing blood glucose as directed pre breakfast and 2 hours after each meal. Review of Log Book shows:    The following learning objectives reviewed during follow-up visit:   Effect of stress on blood sugar  Foods that are included in carbohydrate category  How to create a balanced meal  Plan:  Plan to bring your after meal blood sugar to 120 or below:  Add peanut butter to your oatmeal. It is okay to sweeten with Splenda  Instead of 2 or 3 pieces of toast with jelly, have 1 slice of toast and eat protein, such as an eggs, with it.  Change the type of yogurt you are eating. Try [place] yogurt with some fresh fruit and sometimes have almond yogurt.  For protein choice with fruit try plain nuts without added sugar  Patient instructed to monitor glucose levels: FBS: 60 - 95 mg/dl 2 hour: <998 mg/dl  Patient received the following handouts:  Green carbohydrate sheet in  Spanish  MyPlate  Printed AVS with plan - Used Google Translation page and patient read before leaving and states it was accurate in the summary of our visit/plan.  Patient will be seen for follow-up in 1 weeks or as needed.

## 2020-06-27 NOTE — Patient Instructions (Signed)
Plan to bring your after meal blood sugar to 120 or below: Add peanut butter to your oatmeal. It is okay to sweeten with Splenda Instead of 2 or 3 pieces of toast with jelly, have 1 slice of toast and eat protein, such as an egg, with it. Change the type of yogurt you are eating. Try [place] yogurt with some fresh fruit and sometimes have almond yogurt. For protein choice with fruit try plain nuts without added sugar  Planee llevar su azcar en sangre despus de las comidas a 120 o menos: Agrega mantequilla de man a tu avena. Est bien endulzar con Splenda En lugar de 2 o 3 tostadas con gelatina, consuma 1 rebanada de [place] y [place] protenas, como un [place]. Cambie el tipo de yogur que est comiendo. Pruebe el yogur griego con algo de fruta fresca y, a veces, coma yogur de almendras. Para elegir protenas con frutas, pruebe nueces simples sin azcar agregada

## 2020-07-04 ENCOUNTER — Other Ambulatory Visit: Payer: Self-pay

## 2020-07-04 ENCOUNTER — Encounter: Payer: Self-pay | Attending: Obstetrics and Gynecology | Admitting: Registered"

## 2020-07-04 ENCOUNTER — Ambulatory Visit: Payer: Self-pay | Admitting: Registered"

## 2020-07-04 DIAGNOSIS — O24410 Gestational diabetes mellitus in pregnancy, diet controlled: Secondary | ICD-10-CM | POA: Insufficient documentation

## 2020-07-04 DIAGNOSIS — O24419 Gestational diabetes mellitus in pregnancy, unspecified control: Secondary | ICD-10-CM

## 2020-07-04 DIAGNOSIS — Z3A00 Weeks of gestation of pregnancy not specified: Secondary | ICD-10-CM | POA: Insufficient documentation

## 2020-07-04 NOTE — Progress Notes (Signed)
Video Interpreter services provided by [facility] from language services  Patient was seen on 07/04/20 for follow-up assessment and education for Gestational Diabetes. EDD 08/13/20, [redacted]w[redacted]d. Patient states changes to diet/lifestyle including no jelly on toast, more protein including switching to [place] yogurt, and increasing intake of beans and lentils     The following learning objectives reviewed during follow-up visit:   Praised patient for the effort made to make dietary changes made since 06/27/20 and keeping blood sugar WNL  Plan:   Continue with changes made to diet   Patient instructed to monitor glucose levels: FBS: 60 - 95 mg/dl 2 hour: <124 mg/dl  Patient received the following handouts:  none  Patient will be seen for follow-up as needed.

## 2020-07-12 ENCOUNTER — Ambulatory Visit (INDEPENDENT_AMBULATORY_CARE_PROVIDER_SITE_OTHER): Payer: Self-pay | Admitting: Obstetrics and Gynecology

## 2020-07-12 ENCOUNTER — Other Ambulatory Visit: Payer: Self-pay

## 2020-07-12 VITALS — BP 134/88 | HR 105 | Wt 204.0 lb

## 2020-07-12 DIAGNOSIS — O24410 Gestational diabetes mellitus in pregnancy, diet controlled: Secondary | ICD-10-CM

## 2020-07-12 DIAGNOSIS — O2343 Unspecified infection of urinary tract in pregnancy, third trimester: Secondary | ICD-10-CM

## 2020-07-12 DIAGNOSIS — B951 Streptococcus, group B, as the cause of diseases classified elsewhere: Secondary | ICD-10-CM

## 2020-07-12 DIAGNOSIS — Z3A35 35 weeks gestation of pregnancy: Secondary | ICD-10-CM

## 2020-07-12 LAB — POCT URINALYSIS DIP (DEVICE)
Bilirubin Urine: NEGATIVE
Glucose, UA: NEGATIVE mg/dL
Hgb urine dipstick: NEGATIVE
Ketones, ur: NEGATIVE mg/dL
Leukocytes,Ua: NEGATIVE
Nitrite: NEGATIVE
Protein, ur: 100 mg/dL — AB
Specific Gravity, Urine: 1.025 (ref 1.005–1.030)
Urobilinogen, UA: 0.2 mg/dL (ref 0.0–1.0)
pH: 6.5 (ref 5.0–8.0)

## 2020-07-12 NOTE — Progress Notes (Signed)
Prenatal Visit Note Date: 07/12/2020 Clinic: [facility]  Subjective:  Kristine Bryant is a 34 y.o. G1P0000 at [redacted]w[redacted]d being seen today for ongoing prenatal care.  She is currently monitored for the following issues for this high-risk pregnancy and has Supervision of other normal pregnancy, antepartum; Group B streptococcus urinary tract infection affecting pregnancy; Carpal tunnel syndrome during pregnancy; Localized swelling of both lower legs; Gestational diabetes; and Language barrier on their problem list.  Patient reports no complaints.   Contractions: Irritability. Vag. Bleeding: None.  Movement: Present. Denies leaking of fluid.   The following portions of the patient's history were reviewed and updated as appropriate: allergies, current medications, past family history, past medical history, past social history, past surgical history and problem list. Problem list updated.  Objective:   Vitals:   07/12/20 0904  BP: 134/88  Pulse: (!) 105  Weight: 204 lb (92.5 kg)    Fetal Status: Fetal Heart Rate (bpm): 154   Movement: Present     General:  Alert, oriented and cooperative. Patient is in no acute distress.  Skin: Skin is warm and dry. No rash noted.   Cardiovascular: Normal heart rate noted  Respiratory: Normal respiratory effort, no problems with respiration noted  Abdomen: Soft, gravid, appropriate for gestational age. Pain/Pressure: Present     Pelvic:  Cervical exam deferred        Extremities: Normal range of motion.  Edema: Trace  Mental Status: Normal mood and affect. Normal behavior. Normal judgment and thought content.   Urinalysis:      Assessment and Plan:  Pregnancy: G1P0000 at [redacted]w[redacted]d  1. [redacted] weeks gestation of pregnancy Routine care. GC/CT nv  2. GDM, class A1 Great log book qid. Has surveillance growth nv Set up IOL at 39wks. No delivery on 08-27-23 (day of the dead in her home country [place]  3. Group B Streptococcus  urinary tract infection affecting pregnancy in third trimester tx in labor  Preterm labor symptoms and general obstetric precautions including but not limited to vaginal bleeding, contractions, leaking of fluid and fetal movement were reviewed in detail with the patient. Please refer to After Visit Summary for other counseling recommendations.  Return in about 1 week (around 07/19/2020) for md or app, in person.   [place] Bing, MD

## 2020-07-19 ENCOUNTER — Encounter: Payer: Self-pay | Admitting: Family Medicine

## 2020-07-19 ENCOUNTER — Encounter (HOSPITAL_COMMUNITY): Payer: Self-pay | Admitting: Obstetrics and Gynecology

## 2020-07-19 ENCOUNTER — Ambulatory Visit (INDEPENDENT_AMBULATORY_CARE_PROVIDER_SITE_OTHER): Payer: Self-pay | Admitting: Family Medicine

## 2020-07-19 ENCOUNTER — Other Ambulatory Visit: Payer: Self-pay

## 2020-07-19 ENCOUNTER — Inpatient Hospital Stay (HOSPITAL_COMMUNITY)
Admission: AD | Admit: 2020-07-19 | Discharge: 2020-07-19 | Disposition: A | Discharge status: Home / Self Care | Payer: Self-pay | Attending: Obstetrics and Gynecology | Admitting: Obstetrics and Gynecology

## 2020-07-19 ENCOUNTER — Ambulatory Visit: Payer: Self-pay | Attending: Obstetrics and Gynecology

## 2020-07-19 ENCOUNTER — Encounter: Payer: Self-pay | Admitting: *Deleted

## 2020-07-19 ENCOUNTER — Ambulatory Visit: Payer: Self-pay | Admitting: *Deleted

## 2020-07-19 ENCOUNTER — Other Ambulatory Visit: Payer: Self-pay | Admitting: Obstetrics

## 2020-07-19 VITALS — BP 125/78 | HR 101

## 2020-07-19 VITALS — BP 147/98 | HR 99 | Wt 206.2 lb

## 2020-07-19 DIAGNOSIS — O26893 Other specified pregnancy related conditions, third trimester: Secondary | ICD-10-CM | POA: Insufficient documentation

## 2020-07-19 DIAGNOSIS — B951 Streptococcus, group B, as the cause of diseases classified elsewhere: Secondary | ICD-10-CM

## 2020-07-19 DIAGNOSIS — R030 Elevated blood-pressure reading, without diagnosis of hypertension: Secondary | ICD-10-CM | POA: Insufficient documentation

## 2020-07-19 DIAGNOSIS — Z3A36 36 weeks gestation of pregnancy: Secondary | ICD-10-CM

## 2020-07-19 DIAGNOSIS — O24410 Gestational diabetes mellitus in pregnancy, diet controlled: Secondary | ICD-10-CM

## 2020-07-19 DIAGNOSIS — Z8759 Personal history of other complications of pregnancy, childbirth and the puerperium: Secondary | ICD-10-CM

## 2020-07-19 DIAGNOSIS — O99353 Diseases of the nervous system complicating pregnancy, third trimester: Secondary | ICD-10-CM | POA: Insufficient documentation

## 2020-07-19 DIAGNOSIS — R42 Dizziness and giddiness: Secondary | ICD-10-CM | POA: Insufficient documentation

## 2020-07-19 DIAGNOSIS — O368130 Decreased fetal movements, third trimester, not applicable or unspecified: Secondary | ICD-10-CM

## 2020-07-19 DIAGNOSIS — Z3480 Encounter for supervision of other normal pregnancy, unspecified trimester: Secondary | ICD-10-CM

## 2020-07-19 DIAGNOSIS — Z362 Encounter for other antenatal screening follow-up: Secondary | ICD-10-CM

## 2020-07-19 DIAGNOSIS — O42913 Preterm premature rupture of membranes, unspecified as to length of time between rupture and onset of labor, third trimester: Secondary | ICD-10-CM

## 2020-07-19 DIAGNOSIS — O2343 Unspecified infection of urinary tract in pregnancy, third trimester: Secondary | ICD-10-CM

## 2020-07-19 DIAGNOSIS — Z0371 Encounter for suspected problem with amniotic cavity and membrane ruled out: Secondary | ICD-10-CM

## 2020-07-19 DIAGNOSIS — O24419 Gestational diabetes mellitus in pregnancy, unspecified control: Secondary | ICD-10-CM

## 2020-07-19 DIAGNOSIS — Z789 Other specified health status: Secondary | ICD-10-CM

## 2020-07-19 DIAGNOSIS — Z3689 Encounter for other specified antenatal screening: Secondary | ICD-10-CM

## 2020-07-19 DIAGNOSIS — G43109 Migraine with aura, not intractable, without status migrainosus: Secondary | ICD-10-CM | POA: Insufficient documentation

## 2020-07-19 HISTORY — DX: Type 2 diabetes mellitus without complications: E11.9

## 2020-07-19 LAB — URINALYSIS, ROUTINE W REFLEX MICROSCOPIC
Bilirubin Urine: NEGATIVE
Glucose, UA: NEGATIVE mg/dL
Hgb urine dipstick: NEGATIVE
Ketones, ur: NEGATIVE mg/dL
Leukocytes,Ua: NEGATIVE
Nitrite: NEGATIVE
Protein, ur: 100 mg/dL — AB
Specific Gravity, Urine: 1.012 (ref 1.005–1.030)
pH: 6 (ref 5.0–8.0)

## 2020-07-19 LAB — CBC
HCT: 37.6 % (ref 36.0–46.0)
Hemoglobin: 12.4 g/dL (ref 12.0–15.0)
MCH: 29.5 pg (ref 26.0–34.0)
MCHC: 33 g/dL (ref 30.0–36.0)
MCV: 89.3 fL (ref 80.0–100.0)
Platelets: 186 10*3/uL (ref 150–400)
RBC: 4.21 MIL/uL (ref 3.87–5.11)
RDW: 13.2 % (ref 11.5–15.5)
WBC: 10 10*3/uL (ref 4.0–10.5)
nRBC: 0 % (ref 0.0–0.2)

## 2020-07-19 LAB — COMPREHENSIVE METABOLIC PANEL
ALT: 10 U/L (ref 0–44)
AST: 20 U/L (ref 15–41)
Albumin: 2.8 g/dL — ABNORMAL LOW (ref 3.5–5.0)
Alkaline Phosphatase: 118 U/L (ref 38–126)
Anion gap: 13 (ref 5–15)
BUN: 8 mg/dL (ref 6–20)
CO2: 19 mmol/L — ABNORMAL LOW (ref 22–32)
Calcium: 9.2 mg/dL (ref 8.9–10.3)
Chloride: 103 mmol/L (ref 98–111)
Creatinine, Ser: 0.59 mg/dL (ref 0.44–1.00)
GFR, Estimated: >60 mL/min (ref >60)
Glucose, Bld: 111 mg/dL — ABNORMAL HIGH (ref 70–99)
Potassium: 3.7 mmol/L (ref 3.5–5.1)
Sodium: 135 mmol/L (ref 135–145)
Total Bilirubin: 0.4 mg/dL (ref 0.3–1.2)
Total Protein: 6.2 g/dL — ABNORMAL LOW (ref 6.5–8.1)

## 2020-07-19 LAB — PROTEIN / CREATININE RATIO, URINE
Creatinine, Urine: 71.77 mg/dL
Protein Creatinine Ratio: 1.92 mg/mg{Cre} — ABNORMAL HIGH (ref 0.00–0.15)
Total Protein, Urine: 138 mg/dL

## 2020-07-19 LAB — AMNISURE RUPTURE OF MEMBRANE (ROM) NOT AT ARMC: Amnisure ROM: NEGATIVE

## 2020-07-19 MED ORDER — METOCLOPRAMIDE HCL 10 MG PO TABS: 10.0000 mg | ORAL_TABLET | Freq: Three times a day (TID) | ORAL | 1 refills | Status: DC

## 2020-07-19 MED ORDER — ACETAMINOPHEN 500 MG PO TABS
1000.0000 mg | ORAL_TABLET | Freq: Once | ORAL | Status: AC
  Administered 2020-07-19: 1000 mg via ORAL
  Filled 2020-07-19: qty 2

## 2020-07-19 MED ORDER — METOCLOPRAMIDE HCL 10 MG PO TABS
10.0000 mg | ORAL_TABLET | Freq: Once | ORAL | Status: AC
  Administered 2020-07-19: 10 mg via ORAL
  Filled 2020-07-19: qty 1

## 2020-07-19 NOTE — Progress Notes (Signed)
C/o" decreased FM, contractions every 30 min, leaking clear fluid since 7pm yesterday."

## 2020-07-19 NOTE — Discharge Instructions (Signed)
 Preeclampsia y eclampsia Preeclampsia and Eclampsia La preeclampsia es una afeccin grave que puede desarrollarse durante el embarazo. Esta afeccin causa presin arterial alta y el aumento de protena en la orina, junto con otros sntomas, como dolores de cabeza y cambios en la visin. Estos sntomas pueden aparecer a medida que la afeccin empeora. La preeclampsia puede presentarse a las 20semanas de gestacin o posteriormente. El diagnstico y el tratamiento tempranos de la preeclampsia son muy importantes. Si no se la trata de inmediato, puede causarles problemas graves a usted y al beb. Una complicacin que puede provocar es la eclampsia. La eclampsia es una afeccin que causa temblores o contracciones musculares (convulsiones) y otros problemas graves en la madre. Durante el embarazo, el parto del beb puede ser el mejor tratamiento para la preeclampsia o la eclampsia. En la mayora de las mujeres, los sntomas de la preeclampsia y la eclampsia desaparecen despus de dar a luz. En casos poco frecuentes, una mujer puede desarrollar preeclampsia despus de dar a luz (preeclampsia posparto). Esto normalmente ocurre dentro de las 48 horas despus del nacimiento del beb, pero puede aparecer hasta 6 semanas despus de dar a luz. Cules son las causas? Se desconoce la causa de la preeclampsia. Qu incrementa el riesgo? Los siguientes factores de riesgo pueden hacer que usted sea ms propensa a tener preeclampsia:  Estar embarazada por primera vez.  Haber tenido preeclampsia durante un embarazo anterior.  Tener antecedentes familiares de preeclampsia.  Tener presin arterial alta.  Estar embarazada de ms de un beb.  Tener 35aos o ms.  Ser de raza afroamericana.  Tener enfermedad renal o diabetes.  Tener afecciones mdicas como lupus o enfermedades de la sangre.  Tener mucho sobrepeso (obesidad). Cules son los signos o los sntomas? Los sntomas ms frecuentes son los  siguientes:  Dolor de cabeza intenso.  Problemas visuales, como visin doble o borrosa.  Dolor abdominal, especialmente en la zona superior del abdomen. Otros sntomas que pueden aparecer a medida que la afeccin empeora incluyen:  Aumento repentino de peso.  Hinchazn repentina de las manos, el rostro, las piernas y los pies.  Nuseas y vmitos intensos.  Adormecimiento del rostro, los brazos, las piernas y los pies.  Mareos.  Orinar menos que lo habitual.  Hablar arrastrando las palabras.  Convulsiones. Cmo se diagnostica? No hay pruebas de deteccin de la preeclampsia. El mdico le har preguntas sobre los sntomas y verificar la presencia de signos de preeclampsia durante las visitas prenatales. Tambin pueden hacerle otros estudios que incluyen:  Control de la presin arterial.  Anlisis de orina para detectar la presencia protenas. El mdico la controlar en cada visita prenatal.  Anlisis de sangre.  Control de la frecuencia cardaca del beb.  Ecografa. Cmo se trata? Usted junto con su mdico determinarn el mejor enfoque de tratamiento para usted. El tratamiento puede incluir:  Realizarse exmenes prenatales con ms frecuencia para detectar signos de preeclampsia, si tiene un mayor riesgo de desarrollar dicha afeccin.  Medicamentos para bajar la presin arterial.  Permanecer en el hospital si la afeccin es grave. All, el tratamiento estar centrado en el control de la presin arterial y la cantidad de lquidos en el cuerpo (retencin de lquidos).  Tomar medicamentos (sulfato de magnesio) para prevenir las convulsiones. El medicamento se puede administrar como una inyeccin o por va intravenosa.  Tomar una dosis baja de aspirina durante el embarazo.  Dar a luz a su beb antes de tiempo. Pueden provocarle el trabajo de parto con medicamentos (  o realizarle un parto por cesrea. Siga estas instrucciones en su casa: Comida y  bebida   Beba suficiente lquido como para Pharmacologist la orina de color amarillo plido.  Evite la cafena. Estilo de vida  No consuma ningn producto que contenga nicotina o tabaco, como cigarrillos y Administrator, Civil Service. Si necesita ayuda para dejar de fumar, consulte al mdico.  No consuma drogas ni alcohol.  Evite las situaciones estresantes tanto como sea posible. Haga reposo y [place]. Instrucciones generales  [employer] de venta libre y los recetados solamente como se lo haya indicado el mdico.  Acustese del lado izquierdo mientras hace reposo. Esto mantiene la presin fuera de los vasos sanguneos principales.  Levante (eleve) los pies mientras est sentada o [place]. Intente colocar algunas almohadas debajo de las pantorrillas.  Haga ejercicio regularmente. Consulte al mdico qu tipos de ejercicios son seguros para usted.  Concurra a todas las visitas prenatales y de control como se lo haya indicado el mdico. Esto es importante. Cmo se evita? No se conoce una forma de prevenir el desarrollo de la preeclampsia o la eclampsia. Sin embargo, para reducir Nurse, adult de Warehouse manager complicaciones y Engineer, manufacturing problemas de [place] temprana, haga lo siguiente:  Reciba el cuidado prenatal con regularidad. Es posible que el mdico pueda diagnosticar y tratar la afeccin de forma temprana.  Mantenga un peso saludable. Pdale al mdico que la ayude a Chief Operating Officer su peso durante del [place].  Trabaje con el mdico para controlar cualquier afeccin mdica a largo plazo (crnica) que tenga, como diabetes o problemas renales.  Es posible que le hagan estudios de la presin arterial y de la funcin renal despus del parto.  El mdico puede indicarle que tome una dosis baja de aspirina durante el prximo [place]. Comunquese con un mdico si:  Tiene sntomas que su mdico le ha informado que pueden requerir ms tratamiento o control, por ejemplo: ? Dolores de  [place]. ? Nuseas o vmitos. ? Dolor abdominal. ? Mareos. ? Desvanecimiento. Solicite ayuda inmediatamente si:  Siente de forma intensa: ? Dolor abdominal. ? Dolores de cabeza que no mejoran. ? Mareos. ? Problemas de visin. ? Confusin. ? Nuseas o vmitos.  Tiene alguno de los siguientes sntomas: ? Una convulsin. ? Aumento de peso repentino y rpido. ? Hinchazn repentina en las manos, los tobillos o el rostro. ? Dificultad para mover cualquier parte del cuerpo. ? Adormecimiento en alguna parte del cuerpo. ? Dificultad para hablar. ? Hemorragias anormales.  Se desmaya. Resumen  La preeclampsia es una afeccin grave que puede desarrollarse durante el [place].  Esta afeccin causa presin arterial alta y el aumento de protena en la orina, junto con otros sntomas, como dolores de [place] y cambios en la visin.  El diagnstico y el tratamiento tempranos de la preeclampsia son [place]. Si no se la trata de inmediato, puede causarles problemas graves a usted y al beb.  Solicite ayuda de inmediato si tiene sntomas a los que su mdico le indic que debera Chief Technology Officer. Esta informacin no tiene Theme park manager el consejo del mdico. Asegrese de hacerle al mdico cualquier pregunta que tenga. Document Revised: 05/19/2018 Document Reviewed: 03/30/2016 Elsevier Patient Education  2020 [employer].        Signos y sntomas del trabajo de parto Signs and Symptoms of Labor El Roberdel de parto es el proceso natural del cuerpo por el cual se saca al beb, la placenta y el cordn umbilical del tero. Por lo general, el proceso del  trabajo de parto comienza cuando el embarazo ha llegado a su trmino, entre 37 y 40 semanas de Psychiatrist. Cmo sabr que estoy prxima a comenzar el trabajo de parto? A medida que el cuerpo se prepara para el trabajo de parto y el nacimiento del beb, puede notar los siguientes sntomas en las semanas y [place] anteriores al trabajo de  parto propiamente dicho:  Un fuerte deseo de preparar su casa para recibir al nuevo beb. Esto se denomina anidacin. La anidacin puede ser un signo de que se est acercando el [place], y puede ocurrir varias semanas antes del nacimiento. La anidacin puede implicar limpiar y Chiropodist.  Una pequea cantidad de mucosidad espesa y con Pensions consultant de la vagina (aparicin normal de sangre o prdida del tapn mucoso). Esto puede suceder ms de una semana antes de que comience el [place], o puede ocurrir justo antes de que comience el [place] de parto a medida que el cuello uterino comienza a ensancharse (dilatarse). En algunas mujeres, el tapn [employer] entero de una sola vez. En otras, pueden salir pequeas partes del tapn mucoso de forma gradual durante varios das.  El beb se mueve (desciende) a la parte inferior de la pelvis para ponerse en posicin para el nacimiento (aligeramiento). Cuando esto sucede, puede sentir ms presin en la vejiga y el hueso plvico, y menos presin en las costillas. Esto facilitar la respiracin. Tambin puede hacer que necesite orinar con ms frecuencia y que tenga problemas para hacer de vientre.  Tener "contracciones de Multimedia programmer" (contracciones de [employer]) que ocurren a Psychologist, occupational (espaciadas de modo desigual) con una diferencia de ms de 10 minutos. Esto tambin se denomina trabajo de [id number] [address]. Las contracciones de [place] de parto falso son comunes luego del ejercicio o la actividad sexual, y se detendrn si cambia de posicin, descansa o toma lquidos. Estas contracciones son generalmente leves y no se tornan ms fuertes con el tiempo. Pueden sentirse como lo siguiente: ? Un dolor de espalda. ? Calambres leves, similares a los [employer]. ? Tirantez o presin en el abdomen. Otros sntomas tempranos de que el trabajo de parto puede comenzar pronto incluyen:  Nuseas o prdida del apetito.  Diarrea.  Un  repentino estallido de energa o sentirse muy cansada.  Cambios en el humor.  Problemas para dormir. Cmo sabr cuando ha comenzado el trabajo de parto? Los signos de que ha comenzado el trabajo de parto verdadero pueden incluir:  Contracciones a intervalos regulares (espaciadas de modo regular) que se incrementan en intensidad. Esto puede sentirse como presin o estrechamiento intenso en el abdomen, que se desplaza hacia la espalda. ? Las contracciones pueden sentirse tambin como dolor rtmico en la parte superior de los muslos y la espalda que va y viene a intervalos regulares. ? Para las [employer] primerizas, este cambio en intensidad de las contracciones ocurre generalmente a un ritmo ms gradual. ? Las mujeres que ya han sido madres pueden notar una progresin ms rpida de los cambios de las contracciones.  Una sensacin de presin en el rea vaginal.  Ruptura de la bolsa (ruptura de las [place]). Es cuando el saco de lquido que rodea al beb se rompe. Cuando esto sucede, notar que [employer] lquido de la vagina. Este puede ser claro o estar manchado de [place]. Generalmente el trabajo de parto comienza 24 horas despus de la ruptura de [place] puede tomar ms Psychologist, clinical. ? Algunas mujeres notan esto como un  chorro de lquido. ? Otras notan que la ropa interior se moja repetidas veces. Siga estas indicaciones en su casa:   Cuando comience el trabajo de parto o si rompe bolsa, llame al mdico o a la lnea de atencin de enfermera. Ellos, en funcin de su situacin, determinarn cundo debe ir a Location managerhacerse un examen.  Si entra en trabajo de parto temprano, es posible que pueda descansar y Apple Computermanejar los sntomas en su casa. Algunas estrategias para probar en su casa incluyen: ? Tcnicas de respiracin y relajacin. ? Tomar una ducha o un bao de inmersin tibios. ? Optometristscuchar msica. ? Usar una almohadilla trmica en la espalda para Engineer, materialsaliviar el dolor. Si se le indica que use  calor:  Coloque una toalla entre la piel y la fuente de Airline pilotcalor.  Aplique el calor durante 20 a 30minutos.  Retire la fuente de calor si la piel se pone de color rojo brillante. Esto es muy importante si no puede Financial risk analystsentir dolor, calor o fro. Puede correr un riesgo mayor de sufrir quemaduras. Solicite ayuda de inmediato si:  Tiene contracciones dolorosas y regulares cada 5 minutos o menos.  El trabajo de parto comienza antes de que se cumplan las 37 semanas de [place]embarazo.  Tiene fiebre.  Siente un dolor de cabeza intenso que no se [place]al[place].  Elimina cogulos de sangre de color rojo brillante por la vagina.  No siente que el beb se mueva.  Experimenta la aparicin repentina de: ? Dolor de cabeza intenso con problemas de la visin. ? Nuseas, vmitos o diarrea. ? Dolor en el pecho o falta de aire. Estos sntomas pueden Customer service managerindicar una emergencia. Si el mdico recomienda que vaya al hospital o al centro de nacimientos donde va a dar a luz, no conduzca usted misma. Pdale a otra persona que conduzca o llame a los servicios de Associate Professoremergencia (911 en los [place]) Resumen  El trabajo de parto es el proceso natural del cuerpo por el cual se saca al beb, la placenta y el cordn umbilical del tero.  Por lo general, el proceso del Linneustrabajo de parto comienza cuando el embarazo ha llegado a su trmino, entre 37 y 40 semanas de Psychiatristembarazo.  Cuando comience el trabajo de parto o si rompe bolsa, llame al mdico o a la lnea de atencin de enfermera. Ellos, en funcin de su situacin, determinarn cundo debe ir a Location managerhacerse un examen. Esta informacin no tiene Theme park managercomo fin reemplazar el consejo del mdico. Asegrese de hacerle al mdico cualquier pregunta que tenga. Document Revised: 05/24/2017 Document Reviewed: 05/19/2017 Elsevier Patient Education  2020 [employer].        Evaluacin de los movimientos fetales Fetal Movement Counts Nombre del paciente:  ________________________________________________ Kristine ChapmanFecha de parto estimada: ____________________ Young BerryQu es una evaluacin de los movimientos fetales?  Una evaluacin de los movimientos fetales es el registro del nmero de veces que siente que el beb se mueve durante un cierto perodo de Simmstiempo. Esto tambin se puede denominar recuento de patadas fetales. Una evaluacin de movimientos fetales se recomienda a todas las embarazadas. Es posible que le indiquen que comience a Development worker, communityevaluar los movimientos fetales desde la semana 28 de Bo[place] Springsembarazo. Preste atencin cuando sienta que el beb est ms activo. Podr detectar los ciclos en que el beb duerme y est despierto. Tambin podr detectar que ciertas cosas hacen que su beb se mueva ms. Deber realizar una evaluacin de los movimientos fetales en las siguientes situaciones:  Cuando el beb est ms activo habitualmente.  A la  misma hora, [employer]. Un buen momento para evaluar los movimientos fetales es cuando est descansando, despus de haber comido y bebido algo. Cmo debo contar los movimientos fetales? 1. Encuentre un lugar tranquilo y cmodo. Sintese o acustese de lado. 2. Anote la fecha, la hora de inicio y de finalizacin y la cantidad de movimientos que sinti entre esas dos horas. Lleve esta informacin a las visitas de control. 3. Anote la hora de inicio cuando Designer, industrial/product. 4. Cuente las pataditas, revoloteos, chasquidos, vueltas o pinchazos. Debe sentir al menos . 5. Puede dejar de contar despus de haber sentido 10 movimientos o de haber contado [employer]. Anote la hora de finalizacin. 6. Si no siente en 2horas, comunquese con su mdico para obtener ms indicaciones. Es posible que el mdico quiera realizar estudios adicionales para Company secretary del beb. Comunquese con un mdico si:  Siente menos de en 2horas.  El beb no se mueve tanto como suele  hacerlo. Fecha: ____________ Stevan Born inicio: ____________ Stevan Born finalizacin: ____________ Movimientos: ____________ Franco Nones: ____________ Stevan Born inicio: ____________ Stevan Born finalizacin: ____________ Movimientos: ____________ Franco Nones: ____________ Stevan Born inicio: ____________ Stevan Born finalizacin: ____________ Movimientos: ____________ Franco Nones: ____________ Stevan Born inicio: ____________ Stevan Born finalizacin: ____________ Movimientos: ____________ Franco Nones: ____________ Stevan Born inicio: ____________ Mammie Russian de finalizacin: ____________ Movimientos: ____________ Franco Nones: ____________ Stevan Born inicio: ____________ Mammie Russian de finalizacin: ____________ Movimientos: ____________ Franco Nones: ____________ Stevan Born inicio: ____________ Mammie Russian de finalizacin: ____________ Movimientos: ____________ Franco Nones: ____________ Stevan Born inicio: ____________ Stevan Born finalizacin: ____________ Movimientos: ____________ Franco Nones: ____________ Stevan Born inicio: ____________ Mammie Russian de finalizacin: ____________ Movimientos: ____________ Esta informacin no tiene como fin reemplazar el consejo del mdico. Asegrese de hacerle al mdico cualquier pregunta que tenga. Document Revised: 06/20/2019 Document Reviewed: 06/20/2019 Elsevier Patient Education  2020 [employer].

## 2020-07-19 NOTE — Progress Notes (Signed)
Pt. States has been having contractions every 30 mins since yesterday.

## 2020-07-19 NOTE — MAU Provider Note (Signed)
History     CSN: [id number]  Arrival date and time: 07/19/20 1120   First Provider Initiated Contact with Patient 07/19/20 1159      Chief Complaint  Patient presents with  . Hypertension  . Contractions  . Rupture of Membranes   Ms. Kristine Bryant is a 34 y.o. G1P0000 at [redacted]w[redacted]d who presents to MAU for preeclampsia evaluation after she was seen in the office today for a routine visit and found to have new onset elevated pressures. Patient reports her HA began this morning. Patient rates HA as 5/10. Patient reports she has not taken anything for her HA at this time. Patient does have a history of migraine headaches, but reports this does not feel like a migraine. Patient also reports dizziness for the past 2 days. Patient describes this as feeling like the room is spinning.  Patient also reports ctx that started about 730PM last night and reports she is feeling them mildly and they are coming about every 30 minutes.  Patient also reports the baby is moving less since last night. Patient reports she had a BPP at the office today, which she reports was normal. Patient reports she last ate a yogurt at 11AM. Before 11am patient reports she ate a tortilla with cheese and cream around 8AM. Patient reports this is a normal amount of eating for her.   Pt denies blurry vision/seeing spots, N/V, epigastric pain, swelling in face and hands, sudden weight gain. Pt denies chest pain and SOB.  Pt denies constipation, diarrhea, or urinary problems. Pt denies fever, chills, fatigue, sweating or changes in appetite. Pt denies dizziness, light-headedness, weakness.  Pt denies VB, ctx, LOF.  Current pregnancy problems? GDM, new onset HTN Blood Type? O Positive Allergies? NKDA Current medications? Unisom (last took last night) Current [facility] & next appt? [facility], next appt 07/25/2020   OB History    Gravida  1   Para  0   Term  0   Preterm  0   AB  0   Living  0     SAB      TAB       Ectopic      Multiple      Live Births              Past Medical History:  Diagnosis Date  . Diabetes mellitus without complication (HCC)    GDM    Past Surgical History:  Procedure Laterality Date  . APPENDECTOMY      No family history on file.  Social History   Tobacco Use  . Smoking status: Never Smoker  . Smokeless tobacco: Never Used  Vaping Use  . Vaping Use: Never used  Substance Use Topics  . Alcohol use: Not on file    Comment: SOCIAL- LITTLE  . Drug use: Never    Allergies: No Known Allergies  No medications prior to admission.    Review of Systems  Constitutional: Negative for chills, diaphoresis, fatigue and fever.  Eyes: Negative for visual disturbance.  Respiratory: Negative for shortness of breath.   Cardiovascular: Negative for chest pain.  Gastrointestinal: Negative for abdominal pain, constipation, diarrhea, nausea and vomiting.  Genitourinary: Negative for dysuria, flank pain, frequency, pelvic pain, urgency, vaginal bleeding and vaginal discharge.  Neurological: Positive for dizziness and headaches. Negative for weakness and light-headedness.   Physical Exam   Blood pressure 116/85, pulse (!) 106, temperature 97.7 F (36.5 C), temperature source Oral, resp. rate 18, height 5' 3.78" (1.62  m), weight 93.8 kg, last menstrual period 10/27/2019, SpO2 100 %.  Patient Vitals for the past 24 hrs:  BP Temp Temp src Pulse Resp SpO2 Height Weight  07/19/20 1621 116/85 -- -- (!) 106 18 100 % -- --  07/19/20 1605 -- -- -- -- -- 98 % -- --  07/19/20 1600 -- -- -- -- -- 99 % -- --  07/19/20 1555 -- -- -- -- -- 99 % -- --  07/19/20 1550 -- -- -- -- -- 97 % -- --  07/19/20 1545 -- -- -- -- -- 99 % -- --  07/19/20 1540 -- -- -- -- -- 99 % -- --  07/19/20 1535 -- -- -- -- -- 98 % -- --  07/19/20 1530 -- -- -- -- -- 99 % -- --  07/19/20 1525 -- -- -- -- -- 99 % -- --  07/19/20 1520 -- -- -- -- -- 99 % -- --  07/19/20 1515 -- -- -- -- -- 99 % --  --  07/19/20 1510 -- -- -- -- -- 98 % -- --  07/19/20 1505 -- -- -- -- -- 99 % -- --  07/19/20 1455 -- -- -- -- -- 99 % -- --  07/19/20 1450 -- -- -- -- -- 99 % -- --  07/19/20 1447 129/68 -- -- (!) 111 -- -- -- --  07/19/20 1445 -- -- -- -- -- 99 % -- --  07/19/20 1440 -- -- -- -- -- 99 % -- --  07/19/20 1435 -- -- -- -- -- 99 % -- --  07/19/20 1431 135/82 -- -- (!) 110 -- -- -- --  07/19/20 1430 -- -- -- -- -- 99 % -- --  07/19/20 1425 -- -- -- -- -- 99 % -- --  07/19/20 1420 -- -- -- -- -- 99 % -- --  07/19/20 1416 122/60 -- -- (!) 101 -- -- -- --  07/19/20 1415 -- -- -- -- -- 98 % -- --  07/19/20 1410 -- -- -- -- -- 98 % -- --  07/19/20 1401 130/88 -- -- (!) 109 -- -- -- --  07/19/20 1400 -- -- -- -- -- 100 % -- --  07/19/20 1355 -- -- -- -- -- 100 % -- --  07/19/20 1350 -- -- -- -- -- 100 % -- --  07/19/20 1347 129/86 -- -- 91 -- -- -- --  07/19/20 1335 -- -- -- -- -- 98 % -- --  07/19/20 1331 113/68 -- -- 86 -- -- -- --  07/19/20 1330 -- -- -- -- -- 99 % -- --  07/19/20 1325 -- -- -- -- -- 99 % -- --  07/19/20 1320 -- -- -- -- -- 98 % -- --  07/19/20 1318 110/64 -- -- 83 -- -- -- --  07/19/20 1315 -- -- -- -- -- 99 % -- --  07/19/20 1310 -- -- -- -- -- 99 % -- --  07/19/20 1305 -- -- -- -- -- 99 % -- --  07/19/20 1304 -- -- -- -- -- 99 % -- --  07/19/20 1301 108/74 -- -- 99 -- -- -- --  07/19/20 1300 -- -- -- -- -- 99 % -- --  07/19/20 1255 -- -- -- -- -- 99 % -- --  07/19/20 1250 -- -- -- -- -- 99 % -- --  07/19/20 1246 110/70 -- -- 86 -- -- -- --  07/19/20 1245 -- -- -- -- -- 99 % -- --  07/19/20 1240 -- -- -- -- -- 99 % -- --  07/19/20 1235 -- -- -- -- -- 99 % -- --  07/19/20 1231 107/65 -- -- 89 -- 99 % -- --  07/19/20 1225 -- -- -- -- -- 99 % -- --  07/19/20 1220 -- -- -- -- -- 99 % -- --  07/19/20 1216 130/90 -- -- 97 -- -- -- --  07/19/20 1215 -- -- -- -- -- 99 % -- --  07/19/20 1200 (!) 132/100 -- -- 97 -- 99 % -- --  07/19/20 1151 -- -- -- 92 -- -- -- --   07/19/20 1150 -- -- -- -- -- 99 % -- --  07/19/20 1148 (!) 134/94 97.7 F (36.5 C) Oral -- 18 99 % 5' 3.78" (1.62 m) 93.8 kg  07/19/20 1147 -- -- -- -- -- 99 % -- --   Physical Exam Vitals and nursing note reviewed.  Constitutional:      General: She is not in acute distress.    Appearance: Normal appearance. She is not ill-appearing, toxic-appearing or diaphoretic.  HENT:     Head: Normocephalic and atraumatic.  Pulmonary:     Effort: Pulmonary effort is normal.  Neurological:     Mental Status: She is alert and oriented to person, place, and time.  Psychiatric:        Mood and Affect: Mood normal.        Behavior: Behavior normal.        Thought Content: Thought content normal.        Judgment: Judgment normal.    Results for orders placed or performed during the hospital encounter of 07/19/20 (from the past 24 hour(s))  CBC     Status: None   Collection Time: 07/19/20 11:57 AM  Result Value Ref Range   WBC 10.0 4.0 - 10.5 K/uL   RBC 4.21 3.87 - 5.11 MIL/uL   Hemoglobin 12.4 12.0 - 15.0 g/dL   HCT 16.1 36 - 46 %   MCV 89.3 80.0 - 100.0 fL   MCH 29.5 26.0 - 34.0 pg   MCHC 33.0 30.0 - 36.0 g/dL   RDW 09.6 04.5 - 40.9 %   Platelets 186 150 - 400 K/uL   nRBC 0.0 0.0 - 0.2 %  Comprehensive metabolic panel     Status: Abnormal   Collection Time: 07/19/20 11:57 AM  Result Value Ref Range   Sodium 135 135 - 145 mmol/L   Potassium 3.7 3.5 - 5.1 mmol/L   Chloride 103 98 - 111 mmol/L   CO2 19 (L) 22 - 32 mmol/L   Glucose, Bld 111 (H) 70 - 99 mg/dL   BUN 8 6 - 20 mg/dL   Creatinine, Ser 8.11 0.44 - 1.00 mg/dL   Calcium 9.2 8.9 - 91.4 mg/dL   Total Protein 6.2 (L) 6.5 - 8.1 g/dL   Albumin 2.8 (L) 3.5 - 5.0 g/dL   AST 20 15 - 41 U/L   ALT 10 0 - 44 U/L   Alkaline Phosphatase 118 38 - 126 U/L   Total Bilirubin 0.4 0.3 - 1.2 mg/dL   GFR, Estimated >78 >29 mL/min   Anion gap 13 5 - 15  Protein / creatinine ratio, urine     Status: Abnormal   Collection Time: 07/19/20 12:05  PM  Result Value Ref Range   Creatinine, Urine 71.77 mg/dL   Total Protein, Urine 138 mg/dL   Protein Creatinine Ratio 1.92 (H) 0.00 - 0.15 mg/mg[Cre]  Urinalysis, Routine w reflex microscopic     Status: Abnormal   Collection Time: 07/19/20 12:29 PM  Result Value Ref Range   Color, Urine YELLOW YELLOW   APPearance HAZY (A) CLEAR   Specific Gravity, Urine 1.012 1.005 - 1.030   pH 6.0 5.0 - 8.0   Glucose, UA NEGATIVE NEGATIVE mg/dL   Hgb urine dipstick NEGATIVE NEGATIVE   Bilirubin Urine NEGATIVE NEGATIVE   Ketones, ur NEGATIVE NEGATIVE mg/dL   Protein, ur 782 (A) NEGATIVE mg/dL   Nitrite NEGATIVE NEGATIVE   Leukocytes,Ua NEGATIVE NEGATIVE   RBC / HPF 0-5 0 - 5 RBC/hpf   WBC, UA 0-5 0 - 5 WBC/hpf   Bacteria, UA RARE (A) NONE SEEN   Squamous Epithelial / LPF 21-50 0 - 5   Mucus PRESENT   Amnisure rupture of membrane (rom)not at [facility]     Status: None   Collection Time: 07/19/20  3:01 PM  Result Value Ref Range   Amnisure ROM NEGATIVE    [place] MFM FETAL BPP WO NON STRESS  Result Date: 07/19/2020 ----------------------------------------------------------------------  OBSTETRICS REPORT                       (Signed Final 07/19/2020 11:44 am) ---------------------------------------------------------------------- Patient Info  ID #:       [id number]                          D.O.B.:  1986/05/29 (33 yrs)  Name:       Kristine Bryant             Visit Date: 07/19/2020 09:01 am ---------------------------------------------------------------------- Performed By  Attending:        Ma Rings MD         Ref. Address:     [address]                                                             [address]  Performed By:     Eden Lathe BS      [facility]                                 [facility]  Referred  By:      Ivory Broad DAVIS  MD ---------------------------------------------------------------------- Orders  #  Description                           Code        Ordered By  1  [place] MFM OB FOLLOW UP                   76816.01    YU FANG  2  [place] MFM FETAL BPP WO NON               76819.01    YU FANG     STRESS ----------------------------------------------------------------------  #  Order #                     Accession #                Episode #  1  [id number]                   [id number]                 [id number]  2  [id number]                   [id number]                 [id number] ---------------------------------------------------------------------- Indications  Gestational diabetes in pregnancy, diet        O24.410  controlled  Decreased fetal movements, third trimester,    O36.8130  unspecified  Encounter for other antenatal screening        Z36.2  follow-up  Leakage of amniotic fluid                      O42.90  [redacted] weeks gestation of pregnancy                Z3A.36 ---------------------------------------------------------------------- Vital Signs                                                 Height:        5'3" ---------------------------------------------------------------------- Fetal Evaluation  Num Of Fetuses:         1  Fetal Heart Rate(bpm):  132  Cardiac Activity:       Observed  Presentation:           Cephalic  Amniotic Fluid  AFI FV:      Within normal limits  AFI Sum(cm)     %Tile       Largest Pocket(cm)  11.2            32          3.5  RUQ(cm)       RLQ(cm)       LUQ(cm)        LLQ(cm)  2.1           2.4           3.5            3.2 ---------------------------------------------------------------------- Biophysical Evaluation  Amniotic F.V:   Within normal limits       F. Tone:        Observed  F. Movement:    Observed  Score:          8/8  F. Breathing:   Observed ---------------------------------------------------------------------- Biometry  BPD:      83.8  mm      G. Age:  33w 5d        3.9  %    CI:        73.24   %    70 - 86                                                          FL/HC:      21.4   %    20.1 - 22.1  HC:      311.2  mm     G. Age:  34w 6d        2.8  %    HC/AC:      0.99        0.93 - 1.11  AC:      313.8  mm     G. Age:  35w 2d         29  %    FL/BPD:     79.4   %    71 - 87  FL:       66.5  mm     G. Age:  34w 2d          5  %    FL/AC:      21.2   %    20 - 24  HUM:      58.7  mm     G. Age:  34w 0d         23  %  LV:          2  mm  Est. FW:    2519  gm      5 lb 9 oz     15  % ---------------------------------------------------------------------- OB History  Gravidity:    1 ---------------------------------------------------------------------- Gestational Age  U/S Today:     34w 4d                                        EDD:   08/26/20  Best:          36w 3d     Det. By:  Previous Ultrasound      EDD:   08/13/20                                      (12/27/19) ---------------------------------------------------------------------- Anatomy  Cranium:               Appears normal         Aortic Arch:            Not well visualized  Cavum:                 Previously seen        Ductal Arch:            Not well visualized  Ventricles:            Appears normal  Diaphragm:              Appears normal  Choroid Plexus:        Previously seen        Stomach:                Appears normal, left                                                                        sided  Cerebellum:            Previously seen        Abdomen:                Appears normal  Posterior Fossa:       Previously seen        Abdominal Wall:         Not well visualized  Nuchal Fold:           Not applicable (>20    Cord Vessels:           Previously seen                         wks GA)  Face:                  Orbits and profile     Kidneys:                Appear normal                         previously seen  Lips:                  Appears normal         Bladder:                 Appears normal  Thoracic:              Appears normal         Spine:                  Not well visualized  Heart:                 Appears normal         Upper Extremities:      Previously seen                         (4CH, axis, and                         situs)  RVOT:                  Previously seen        Lower Extremities:      Previously seen  LVOT:                  Previously seen  Other:  Technically difficult due to advanced GA and fetal position. Nasal          bone visualized. ---------------------------------------------------------------------- Cervix Uterus Adnexa  Cervix  Not visualized (  advanced GA >24wks)  Uterus  No abnormality visualized.  Right Ovary  Not visualized.  Left Ovary  Not visualized.  Cul De Sac  No free fluid seen.  Adnexa  No abnormality visualized. ---------------------------------------------------------------------- Comments  This patient was seen for a follow up growth scan due to diet-  controlled gestational diabetes.  The patient reports that she  has been leaking clear fluid and experiencing contractions  about every half an hour starting at around 7 PM last night.  She was informed that the fetal growth and amniotic fluid  level appears appropriate for her gestational age.  A biophysical profile performed today was 8 out of 8.  The patient will have a prenatal appointment following today's  ultrasound exam.  A rupture of membrane check will be  performed during her prenatal appointment.  Should rupture  of membranes be confirmed, delivery will be recommended at  her current gestational age.  All conversations were discussed with the patient today with  the help of a Spanish interpreter. ----------------------------------------------------------------------                   Ma Rings, MD Electronically Signed Final Report   07/19/2020 11:44 am ----------------------------------------------------------------------  [place] MFM OB DETAIL +14 WK  Result Date:  06/21/2020 ----------------------------------------------------------------------  OBSTETRICS REPORT                       (Signed Final 06/21/2020 01:17 pm) ---------------------------------------------------------------------- Patient Info  ID #:       [id number]                          D.O.B.:  February 09, 1986 (33 yrs)  Name:       Kristine Bryant             Visit Date: 06/21/2020 07:50 am ---------------------------------------------------------------------- Performed By  Attending:        Ma Rings MD         Ref. Address:     [address]                                                             [address]  Performed By:     Lenise Arena        Location[facility]                                     [facility]  Referred By:      [name]                    MD ---------------------------------------------------------------------- Orders  #  Description                           Code        Ordered By  1  [place] MFM OB DETAIL +14 WK               76811.01    KELLY DAVIS ----------------------------------------------------------------------  #  Order #                     Accession #                Episode #  1  [id number]                   [id number]                 [id number] ---------------------------------------------------------------------- Indications  Gestational diabetes in pregnancy, diet        O24.410  controlled  Encounter for antenatal screening for          Z36.3  malformations  [redacted] weeks gestation of pregnancy                Z3A.32 ---------------------------------------------------------------------- Vital Signs  Weight (lb): 199                               Height:        5'3"  BMI:         35.25 ---------------------------------------------------------------------- Fetal Evaluation  Num Of Fetuses:         1   Fetal Heart Rate(bpm):  160  Cardiac Activity:       Observed  Presentation:           Cephalic  Placenta:               Anterior  P. Cord Insertion:      Visualized, central  Amniotic Fluid  AFI FV:      Within normal limits  AFI Sum(cm)     %Tile       Largest Pocket(cm)  11.44           28          5.12  RUQ(cm)       RLQ(cm)       LUQ(cm)        LLQ(cm)  5.12          2.95          0              3.37 ---------------------------------------------------------------------- Biometry  BPD:        76  mm     G. Age:  30w 3d        3.7  %    CI:        75.58   %    70 - 86  FL/HC:      21.7   %    19.1 - 21.3  HC:      277.2  mm     G. Age:  30w 2d        < 1  %    HC/AC:      0.96        0.96 - 1.17  AC:      287.7  mm     G. Age:  32w 5d         60  %    FL/BPD:     79.2   %    71 - 87  FL:       60.2  mm     G. Age:  31w 2d         13  %    FL/AC:      20.9   %    20 - 24  LV:          2  mm  Est. FW:    1859  gm      4 lb 2 oz     24  % ---------------------------------------------------------------------- OB History  Gravidity:    1 ---------------------------------------------------------------------- Gestational Age  U/S Today:     31w 1d                                        EDD:   08/22/20  Best:          Armida Sans 3d     Det. By:  Previous Ultrasound      EDD:   08/13/20                                      (12/27/19) ---------------------------------------------------------------------- Anatomy  Cranium:               Appears normal         Aortic Arch:            Not well visualized  Cavum:                 Appears normal         Ductal Arch:            Not well visualized  Ventricles:            Appears normal         Diaphragm:              Appears normal  Choroid Plexus:        Appears normal         Stomach:                Appears normal, left                                                                        sided  Cerebellum:            Appears  normal         Abdomen:  Appears normal  Posterior Fossa:       Appears normal         Abdominal Wall:         Not well visualized  Nuchal Fold:           Appears normal         Cord Vessels:           Appears normal (3                                                                        vessel cord)  Face:                  Appears normal         Kidneys:                Appear normal                         (orbits and profile)  Lips:                  Appears normal         Bladder:                Appears normal  Thoracic:              Appears normal         Spine:                  Not well visualized  Heart:                 Appears normal         Upper Extremities:      Appears normal                         (4CH, axis, and                         situs)  RVOT:                  Appears normal         Lower Extremities:      Appears normal  LVOT:                  Appears normal  Other:  Technically difficult due to advanced GA and fetal position. ---------------------------------------------------------------------- Cervix Uterus Adnexa  Cervix  Not visualized (advanced GA >24wks) ---------------------------------------------------------------------- Comments  This patient was seen for an ultrasound exam due to recently  diagnosed diet-controlled gestational diabetes.  [facility]  [facility]  She was informed that the fetal growth and amniotic fluid  level appears appropriate for her gestational age.  The views of the fetal anatomy were limited today due to her  advanced gestational age.  The implications and management of diabetes in pregnancy  was discussed in detail with the patient. She was advised that  our goals for her fingerstick values are fasting values of 90-95  or less and two-hour postprandials of 120 or less.  Should her  fingerstick values be  above these values, she may have to be  started on insulin or metformin to help her achieve better   glycemic control. The patient was advised that getting her  fingerstick values as close to these goals as possible would  provide her with the most optimal obstetrical outcome.  A follow up exam was scheduled in 4 weeks to assess the  fetal growth.  Weekly fetal testing would be indicated should  she require insulin or Metformin for treatment of gestational  diabetes.  All conversations were held with the patient today with the  help of a Spanish interpreter. ----------------------------------------------------------------------                   Ma Rings, MD Electronically Signed Final Report   06/21/2020 01:17 pm ----------------------------------------------------------------------  [place] MFM OB FOLLOW UP  Result Date: 07/19/2020 ----------------------------------------------------------------------  OBSTETRICS REPORT                       (Signed Final 07/19/2020 11:44 am) ---------------------------------------------------------------------- Patient Info  ID #:       [id number]                          D.O.B.:  04/18/1986 (33 yrs)  Name:       Kristine Bryant             Visit Date: 07/19/2020 09:01 am ---------------------------------------------------------------------- Performed By  Attending:        Ma Rings MD         Ref. Address:     [address]                                                             [address]  Performed By:     Eden Lathe BS      Location[facility]                                 [facility]  Referred By:      [name]                    MD ---------------------------------------------------------------------- Orders  #  Description  Code        Ordered By  1  [place] MFM OB FOLLOW UP                   [id number]    Rosana Hoes  2  [place] MFM FETAL BPP WO NON                [id number]    YU FANG     STRESS ----------------------------------------------------------------------  #  Order #                     Accession #                Episode #  1  [id number]                   [id number]                 [id number]  2  [id number]                   [id number]                 [id number] ---------------------------------------------------------------------- Indications  Gestational diabetes in pregnancy, diet        O24.410  controlled  Decreased fetal movements, third trimester,    O36.8130  unspecified  Encounter for other antenatal screening        Z36.2  follow-up  Leakage of amniotic fluid                      O42.90  [redacted] weeks gestation of pregnancy                Z3A.36 ---------------------------------------------------------------------- Vital Signs                                                 Height:        5'3" ---------------------------------------------------------------------- Fetal Evaluation  Num Of Fetuses:         1  Fetal Heart Rate(bpm):  132  Cardiac Activity:       Observed  Presentation:           Cephalic  Amniotic Fluid  AFI FV:      Within normal limits  AFI Sum(cm)     %Tile       Largest Pocket(cm)  11.2            32          3.5  RUQ(cm)       RLQ(cm)       LUQ(cm)        LLQ(cm)  2.1           2.4           3.5            3.2 ---------------------------------------------------------------------- Biophysical Evaluation  Amniotic F.V:   Within normal limits       F. Tone:        Observed  F. Movement:    Observed                   Score:          8/8  F. Breathing:   Observed ---------------------------------------------------------------------- Biometry  BPD:      83.8  mm  G. Age:  33w 5d        3.9  %    CI:        73.24   %    70 - 86                                                          FL/HC:      21.4   %    20.1 - 22.1  HC:      311.2  mm     G. Age:  34w 6d        2.8  %    HC/AC:      0.99        0.93 - 1.11  AC:      313.8  mm     G. Age:  35w 2d          29  %    FL/BPD:     79.4   %    71 - 87  FL:       66.5  mm     G. Age:  34w 2d          5  %    FL/AC:      21.2   %    20 - 24  HUM:      58.7  mm     G. Age:  34w 0d         23  %  LV:          2  mm  Est. FW:    2519  gm      5 lb 9 oz     15  % ---------------------------------------------------------------------- OB History  Gravidity:    1 ---------------------------------------------------------------------- Gestational Age  U/S Today:     34w 4d                                        EDD:   08/26/20  Best:          36w 3d     Det. By:  Previous Ultrasound      EDD:   08/13/20                                      (12/27/19) ---------------------------------------------------------------------- Anatomy  Cranium:               Appears normal         Aortic Arch:            Not well visualized  Cavum:                 Previously seen        Ductal Arch:            Not well visualized  Ventricles:            Appears normal         Diaphragm:              Appears normal  Choroid Plexus:        Previously seen  Stomach:                Appears normal, left                                                                        sided  Cerebellum:            Previously seen        Abdomen:                Appears normal  Posterior Fossa:       Previously seen        Abdominal Wall:         Not well visualized  Nuchal Fold:           Not applicable (>20    Cord Vessels:           Previously seen                         wks GA)  Face:                  Orbits and profile     Kidneys:                Appear normal                         previously seen  Lips:                  Appears normal         Bladder:                Appears normal  Thoracic:              Appears normal         Spine:                  Not well visualized  Heart:                 Appears normal         Upper Extremities:      Previously seen                         (4CH, axis, and                         situs)  RVOT:                   Previously seen        Lower Extremities:      Previously seen  LVOT:                  Previously seen  Other:  Technically difficult due to advanced GA and fetal position. Nasal          bone visualized. ---------------------------------------------------------------------- Cervix Uterus Adnexa  Cervix  Not visualized (advanced GA >24wks)  Uterus  No abnormality visualized.  Right Ovary  Not visualized.  Left Ovary  Not visualized.  Cul De Sac  No free fluid seen.  Adnexa  No abnormality  visualized. ---------------------------------------------------------------------- Comments  This patient was seen for a follow up growth scan due to diet-  controlled gestational diabetes.  The patient reports that she  has been leaking clear fluid and experiencing contractions  about every half an hour starting at around 7 PM last night.  She was informed that the fetal growth and amniotic fluid  level appears appropriate for her gestational age.  A biophysical profile performed today was 8 out of 8.  The patient will have a prenatal appointment following today's  ultrasound exam.  A rupture of membrane check will be  performed during her prenatal appointment.  Should rupture  of membranes be confirmed, delivery will be recommended at  her current gestational age.  All conversations were discussed with the patient today with  the help of a Spanish interpreter. ----------------------------------------------------------------------                   Ma Rings, MD Electronically Signed Final Report   07/19/2020 11:44 am ----------------------------------------------------------------------   MAU Course  Procedures  MDM -preeclampsia evaluation without severe range BP in MAU on admission -symptoms include: HA/vertigo; Tylenol/Reglan given -UA: hazy/100PRO/rare bacteria -CBC: H/H 12.4/37.6, platelets 186 -CMP: serum creatinine 0.59, AST/ALT 20/10 -PCr: 1.92 -EFM: reactive       -baseline: 145       -variability:  moderate       -accels: present, 15x15       -decels: absent       -TOCO: few, irregular -after medication administration, pt reports HA and vertigo now resolved  -DFM since last night -iced water and snacks given -clicker given to patient to record fetal movement, patient pressed button 10 times in 30 minutes -after drink/snacks, pt reports movement has returned to normal -BPP performed in office today, 8/8  -pt denies LOF to MAU provider, but it is documented in two earlier providers note from today that patient was leaking clear fluid, in this case, AmniSure performed -AmniSure: negative  Dilation: Fingertip Effacement (%):  (unable to determine) Cervical Position: Posterior Exam by:: T LYTLE RN   -consulted with Dr. Crissie Reese, patient should have BP check in office on Monday, will not schedule induction at this time -pt discharged to home in stable condition  Orders Placed This Encounter  Procedures  . CBC    Standing Status:   Standing    Number of Occurrences:   1  . Comprehensive metabolic panel    Standing Status:   Standing    Number of Occurrences:   1  . Protein / creatinine ratio, urine    Standing Status:   Standing    Number of Occurrences:   1  . Urinalysis, Routine w reflex microscopic    Standing Status:   Standing    Number of Occurrences:   1  . Amnisure rupture of membrane (rom)not at [facility]    Standing Status:   Standing    Number of Occurrences:   1  . Discharge patient    Order Specific Question:   Discharge disposition    Answer:   01-Home or Self Care [1]    Order Specific Question:   Discharge patient date    Answer:   07/19/2020   Meds ordered this encounter  Medications  . acetaminophen (TYLENOL) tablet 1,000 mg  . metoCLOPramide (REGLAN) tablet 10 mg  . metoCLOPramide (REGLAN) 10 MG tablet    Sig: Take 1 tablet (10 mg total) by mouth 3 (three) times daily with meals.  Dispense:  90 tablet    Refill:  1    Order Specific Question:    Supervising Provider    Answer:   ERVIN, MICHAEL L [[id number]]    Assessment and Plan   1. Elevated blood pressure reading without diagnosis of hypertension   2. [redacted] weeks gestation of pregnancy   3. NST (non-stress test) reactive   4. Migraine with aura and without status migrainosus, not intractable   5. Decreased fetal movements in third trimester, single or unspecified fetus   6. Encounter for suspected premature rupture of amniotic membranes, with rupture of membranes not found     Allergies as of 07/19/2020   No Known Allergies     Medication List    TAKE these medications   calcium carbonate 500 MG chewable tablet Commonly known as: TUMS - dosed in mg elemental calcium Chew 1 tablet by mouth daily.   Doxylamine-Pyridoxine 10-10 MG Tbec Take 1 pill at night for 3 days then increase to 2 pills at night for 3 days then can increase to 2 pills in the AM and 2 pills in PM   famotidine 20 MG tablet Commonly known as: Pepcid Take 1 tablet (20 mg total) by mouth 2 (two) times daily.   metoCLOPramide 10 MG tablet Commonly known as: REGLAN Take 1 tablet (10 mg total) by mouth 3 (three) times daily with meals.   prenatal multivitamin Tabs tablet Take 1 tablet by mouth daily at 12 noon.       -discussed results and likelihood of dx of PreE on Monday, necessitating delivery on Tuesday of next week -message sent to [facility] to schedule pt for BP check on Monday, pt instructed to call office at 830AM to schedule BP check, Dr. Crissie Reese cc'd on message -discussed implications of MWA with [facility] usage -Reviewed warning blood pressure values (systolic = / > 140 and/or diastolic =/> 90). Explained that, if blood pressure is elevated, she should sit down, rest, and eat/drink something. If still elevated 15 minutes later, and she is greater than 20 weeks, she should call clinic or come to MAU. She should come to MAU if she has elevated pressures and any of the following:  - headache not relieved with  tylenol, rest, hydration -blurry vision, floating spots in her vision - sudden full-body edema or facial edema -RUQ pain that is constant. These symptoms may indicate that her blood pressure is worsening and she may be developing gestational hypertension or pre-eclampsia, which is an emergency. -RX Reglan for HA -fetal movement and labor precautions given -return MAU precautions given -pt discharged to home in stable condition  Joni Reining E Madelyn Tlatelpa 07/19/2020, 4:28 PM

## 2020-07-19 NOTE — Patient Instructions (Addendum)
Tercer trimestre de Psychiatrist Third Trimester of Pregnancy El tercer trimestre comprende desde la [employer] la semana40 (desde el mes7 hasta el mes9). El tercer trimestre es un perodo en el que el beb en gestacin (feto) crece rpidamente. Hacia el final del noveno mes, el feto mide alrededor de 20pulgadas (45cm) de largo y pesa entre 6 y 10 libras (2,700 y 46,500kg). Cambios en el cuerpo durante el tercer trimestre Su organismo continuar atravesando por muchos cambios durante el [place]. Estos cambios varan de [place] a [name]. Durante el tercer trimestre:  Seguir aumentando de [place]. Es de esperar que aumente entre 25 y 35libras (11 y 16kg) hacia el final del Psychiatrist.  Podrn aparecer las primeras Albertson's caderas, el abdomen y las [place].  Puede tener necesidad de Geographical information systems officer con ms frecuencia porque el feto baja hacia la pelvis y ejerce presin sobre la vejiga.  Puede desarrollar o continuar teniendo Merchant navy officer. Esto se debe a que el aumento de las hormonas hace que los msculos en el tubo digestivo trabajen ms lentamente.  Puede desarrollar o continuar teniendo estreimiento debido a que el aumento de las hormonas ralentiza la digestin y hace que los msculos que [employer] desechos a travs de los intestinos se relajen.  Puede desarrollar hemorroides. Estas son venas hinchadas (venas varicosas) en el recto que pueden causar picazn o dolor.  Puede desarrollar venas hinchadas y abultadas (venas varicosas) en las piernas.  Puede presentar ms dolor en la pelvis, la espalda o los muslos. Esto se debe al Citigroup de peso y al aumento de las hormonas que relajan las articulaciones.  Tal vez haya cambios en el cabello. Esto cambios pueden incluir su engrosamiento, crecimiento rpido y [name] textura. Adems, a algunas mujeres se les cae el cabello durante o despus del embarazo, o tienen el cabello seco o fino. Lo ms probable es que el cabello se le  normalice despus del nacimiento del beb.  Sus pechos seguirn creciendo y se pondrn cada vez ms sensibles. Un lquido amarillo Charity fundraiser) puede salir de sus pechos. Esta es la primera leche que usted produce para su beb.  El ombligo puede salir hacia afuera.  Puede observar que se le Eli Lilly and Company, el rostro o los tobillos.  Puede presentar un aumento del hormigueo o entumecimiento en las manos, brazos y piernas. La piel de su vientre tambin puede sentirse entumecida.  Puede sentir que le falta el aire debido a que se expande el tero.  Puede tener ms problemas para dormir. Esto puede deberse al tamao de su vientre, una mayor necesidad de Geographical information systems officer y un aumento en el metabolismo de su cuerpo.  Puede notar que el feto "baja" o lo siente ms bajo, en el abdomen (aligeramiento).  Puede tener un aumento de la secrecin vaginal.  Puede notar que las articulaciones se sienten flojas y puede sentir dolor alrededor del hueso plvico. Qu debe esperar en las visitas prenatales Le harn exmenes prenatales cada 2semanas hasta la semana36. A partir de ese momento le harn los [employer]. Durante una visita prenatal de rutina:  La pesarn para asegurarse de que usted y el beb estn creciendo normalmente.  Le tomarn la presin arterial.  Le medirn el abdomen para controlar el desarrollo del beb.  Se escucharn los latidos cardacos fetales.  Se evaluarn los resultados de los estudios solicitados en visitas anteriores.  Le revisarn el cuello del tero cuando est prxima la fecha de parto para controlar si el  cuello uterino se ha afinado o adelgazado (borrado).  Le harn una prueba de estreptococos del grupo B. Esto sucede [employer] 35 y 29. El mdico puede preguntarle lo siguiente:  Cmo le gustara que fuera el [place].  Cmo se siente.  Si siente los movimientos del beb.  Si ha tenido sntomas anormales, como prdida de lquido, [place], dolores de  cabeza intensos o clicos abdominales.  Si est consumiendo algn producto que contenga tabaco, como cigarrillos, tabaco de Theatre manager y Administrator, Civil Service.  Si tiene Colgate-Palmolive. Otros exmenes o estudios de deteccin que pueden realizarse durante el tercer trimestre incluyen lo siguiente:  Anlisis de sangre para controlar los niveles de hierro (anemia).  Controles fetales para determinar su salud, nivel de [place] y Designer, jewellery. Si tiene [place] enfermedad o hay problemas durante el embarazo, le harn estudios.  Prueba sin estrs. Esta prueba verifica la salud de su beb y se [place] para Engineer, manufacturing signos de problemas, tales como si el beb no est recibiendo suficiente oxgeno. Durante esta prueba, se coloca un cinturn alrededor de su vientre. Al moverse el beb, se controla su frecuencia cardaca. Qu es el falso [place] de [place]? El falso trabajo de parto es una afeccin en la que se sienten pequeos e irregulares espasmos de los msculos del tero (contracciones) que generalmente desaparecen al hacer reposo, cambiar de posicin o al beber agua. Estas contracciones se llaman contracciones de CSX Corporation. Las Fifth [employer]ancorp pueden durar horas, [address] o incluso semanas, antes de que el verdadero trabajo de parto se inicie. Si las contracciones ocurren a intervalos regulares, se vuelven ms frecuentes, aumentan en intensidad o se vuelven dolorosas, debera ver al mdico.  Cules son los signos del [place] de [place]?  Clicos abdominales.  Contracciones regulares que comienzan en intervalos de 10 minutos y se vuelven ms fuertes y ms frecuentes con el tiempo.  Contracciones que comienzan en la parte superior del tero y se extienden hacia abajo, a la zona inferior del abdomen y la espalda.  Aumento de la presin en la pelvis y Armed forces logistics/support/administrative officer en la espalda.  Una secrecin de mucosidad acuosa o con sangre que sale de la vagina.  Prdida de lquido amnitico. Esto tambin se conoce  como "ruptura de la bolsa de las aguas". Esto puede ser un chorro o un goteo constante y lento de lquido. Informe a su mdico si tiene un color u [employer]. Si tiene alguno de Frees[employer], llame a su mdico de inmediato, incluso si es antes de la fecha de [place]. Siga estas indicaciones en su casa: Medicamentos  Siga las indicaciones del mdico en relacin con el uso de medicamentos. Durante el embarazo, hay medicamentos que pueden tomarse y otros que no.  Tome vitaminas prenatales que contengan por lo menos (?g) de cido flico.  Si est estreida, tome un laxante suave, si el mdico lo autoriza. Qu debe comer y beber   Meriel Flavors una dieta equilibrada que incluya gran cantidad de frutas y verduras frescas, cereales integrales, buenas fuentes de protenas como carnes [place], huevos o tofu, y lcteos descremados. El mdico la ayudar a Production assistant, radio cantidad de peso que puede [place].  No coma carne cruda ni quesos sin cocinar. Estos elementos contienen grmenes que pueden causar defectos congnitos en el beb.  Si no consume muchos alimentos con calcio, hable con su mdico sobre si debera tomar un suplemento diario de calcio.  La ingesta diaria de cuatro o cinco comidas pequeas en lugar de tres comidas abundantes.  Limite el consumo de alimentos con alto contenido de grasas y azcares procesados, como alimentos fritos o dulces.  Para evitar el estreimiento: ? Bebe suficiente lquido para mantener la orina clara o de color amarillo plido. ? Consuma alimentos ricos en fibra, como frutas y verduras frescas, cereales integrales y frijoles. Actividad  Haga ejercicio solamente como se lo haya indicado el mdico. La mayora de las mujeres pueden continuar su rutina de ejercicios durante el [place]. Intente realizar como mnimo de actividad fsica por lo menos 5das a la semana. Deje de hacer ejercicio si experimenta contracciones uterinas.  Evite levantar  pesos [employer]ands.  No haga ejercicio en condiciones de calor o humedad extremas, o a grandes alturas.  Use zapatos cmodos de tacn bajo.  Adopte una buena postura.  Puede seguir teniendo The [place], excepto que el mdico le diga lo contrario. Alivio del dolor y del Z[place] pausas frecuentes y descanse con las piernas elevadas si tiene calambres en las piernas o dolor en la zona lumbar.  Dese baos de asiento con agua tibia para Engineer, materials o las molestias causadas por las hemorroides. Use una crema para las hemorroides si el mdico la autoriza.  Use un sostn que le brinde buen soporte para prevenir las molestias causadas por la sensibilidad en los pechos.  Si tiene venas varicosas: ? Use pantimedias que brinden soporte o medias de compresin como se lo haya indicado el mdico. ? Eleve los pies durante , 3 o 4veces por da. Cuidados prenatales  Escriba sus preguntas. Llvelas cuando concurra a las visitas prenatales.  Concurra a todas las visitas prenatales tal como se lo haya indicado el mdico. Esto es importante. Seguridad  Use el cinturn de seguridad en todo momento mientras conduce.  Haga una lista de los nmeros de telfono de Associate Professor, que [employer] nmeros de telfono de familiares, [place], el hospital y los departamentos de polica y bomberos. Instrucciones generales  Evite el contacto con las bandejas sanitarias de los gatos y la tierra que estos animales usan. Estos elementos contienen grmenes que pueden causar defectos congnitos en el beb. Si tiene Financial controller, pdale a alguien que limpie la caja de arena por usted.  No haga viajes largos excepto que sea absolutamente necesario y solo con la autorizacin de su mdico.  No se d baos de inmersin en agua caliente, baos turcos ni saunas.  No beber alcohol.  No consuma ningn producto que contenga nicotina o tabaco, como cigarrillos y Administrator, Civil Service. Si necesita ayuda  para dejar de fumar, consulte al mdico.  No use hierbas medicinales ni medicamentos que no le hayan recetado. Estas sustancias qumicas afectan la formacin y el desarrollo del beb.  No se haga duchas vaginales ni use tampones o toallas higinicas perfumadas.  No mantenga las piernas cruzadas durante largos periodos de [place].  Para prepararse para la llegada de su beb: ? Tome clases prenatales para entender, Education administrator, y hacer preguntas sobre el [place] de parto y [place]. ? Haga un ensayo de la partida al hospital. ? Visite el hospital y recorra el rea de maternidad. ? Pida un permiso de maternidad o paternidad a sus empleadores. ? Organice para que Research scientist (physical sciences) o amigo cuide a sus mascotas mientras usted est en el hospital. ? Compre un asiento de seguridad FirstEnergy [employer], y asegrese de saber cmo instalarlo en su automvil. ? Prepare el bolso que llevar al hospital. ? Prepare la habitacin del beb. Asegrese de quitar todas las Johnson Controls  y animales de peluche de la cuna del beb para evitar la asfixia.  Visite a su dentista si no lo ha Occupational hygienist. Use un cepillo de dientes blando para higienizarse los dientes y psese el hilo dental con suavidad. Comunquese con un mdico si:  No est segura de que est en trabajo de parto o de que ha roto la bolsa de las aguas.  Se siente mareada.  Siente clicos leves, presin en la pelvis o dolor persistente en el abdomen.  Siente dolor en la parte inferior de la espalda.  Tiene nuseas, vmitos o diarrea persistentes.  Brett Fairy secrecin vaginal inusual o con mal olor.  Siente dolor al [employer]oco[employer]illips. Solicite ayuda de inmediato si:  Rompe la bolsa de las aguas antes de la semana 37.  Tiene contracciones regulares en intervalos de menos de 5 minutos antes de la semana 37.  Tiene fiebre.  Tiene una prdida de lquido por la vagina.  Tiene sangrado o pequeas prdidas vaginales.  Tiene dolor o clicos  abdominales intensos.  Baja de peso o sube de peso rpidamente.  Tiene dificultad para respirar y siente dolor de pecho.  Sbitamente se le hinchan mucho el rostro, las [place], los tobillos, los pies o las piernas.  Su beb se mueve menos de 10 veces en 2 horas.  Siente un dolor de cabeza intenso que no se alivia al tomar [name].  Nota cambios en la visin. Resumen  El tercer trimestre comprende desde la semana28 [employer] semana40, es decir, desde el mes7 hasta el [address] El tercer trimestre es un perodo en el que el beb en gestacin (feto) crece rpidamente.  Durante el tercer trimestre, su incomodidad puede aumentar a medida que usted y su beb continan aumentando de [place]. Es posible que tenga dolor abdominal, en las piernas y en la [place], [place] para dormir y [place] mayor necesidad de Geographical information systems officer.  Durante el tercer trimestre, sus pechos seguirn creciendo y se pondrn cada vez ms sensibles. Un lquido amarillo Charity fundraiser) puede salir de sus pechos. Esta es la primera leche que usted produce para su beb.  El falso trabajo de parto es una afeccin en la que se sienten pequeos e irregulares espasmos de los msculos del tero (contracciones) que a Corporate investment banker. Estas contracciones se llaman contracciones de CSX Corporation. Las [employer]anc[employer] pueden durar horas, [address]  [address] o incluso semanas, antes de que el verdadero trabajo de parto se inicie.  Los signos del trabajo de parto pueden incluir: calambres abdominales; contracciones regulares que comienzan en intervalos de 10 minutos y se vuelven ms fuertes y ms frecuentes con el tiempo; una secrecin de mucosidad acuosa o con sangre que sale de la vagina; aumento de la presin en la pelvis y Engineer, mining latente en la espalda; y prdida de lquido amnitico. Esta informacin no tiene Theme park manager el consejo del mdico. Asegrese de hacerle al mdico cualquier pregunta que tenga. Document Revised: 01/05/2017 Document Reviewed:  01/05/2017 Elsevier Patient Education  2020 Elsevier Inc.   Infeccin por estreptococo del grupo&nbsp;B durante el embarazo Group B Streptococcus Infection During Pregnancy El estreptococo del grupo B (EGB) es un tipo de bacteria que se encuentra a [employer] sanas. Generalmente se encuentra en el recto, la vagina y los intestinos. En personas saludables y en mujeres no embarazadas, rara vez la bacteria provoca una enfermedad o complicaciones graves. Sin embargo, las mujeres [place] de EGB es positiva durante el embarazo pueden transmitirle la bacteria al beb en el parto.  Esto puede provocar una infeccin grave en el beb despus del nacimiento. Las mujeres con EGB tambin pueden tener infecciones durante el Psychiatrist o poco despus del [place]. Las infecciones incluyen infecciones de las vas urinarias (IU) o infecciones del tero. Los EGB tambin [employer]ines riesgo de complicaciones durante el [place], como parto o trabajo de parto prematuros, aborto espontneo o muerte fetal. Se recomienda que todas las embarazadas se hagan pruebas de rutina para determinar la presencia de EGB. Cules son las causas? Esta afeccin es causada por la bacteria denominada Streptococcus agalactiae. Qu incrementa el riesgo? Puede tener un mayor riesgo de contraer una infeccin por EGB durante el embarazo si ya le ocurri en un embarazo previo. Cules son los signos o sntomas? En la [employer], la infeccin por EGB no causa sntomas en las [place]. Si hay sntomas, estos pueden incluir:  Inicio del trabajo de parto antes de la semana 37 de gestacin.  Una infeccin urinaria (IU) o en la vejiga. Esto puede causar fiebre, miccin frecuente o dolor y ardor al Geographical information systems officer.  Fiebre durante el [place] de [place]. Tambin puede haber latido cardaco rpido en la madre o en el beb. Los sntomas poco frecuentes pero graves de una posible infeccin por EGB en las mujeres incluyen:  Infeccin en  la sangre (septicemia). Esto puede provocar fiebre, escalofros o confusin.  Infeccin pulmonar (neumona). Esto puede provocar fiebre, escalofros, tos, respiracin rpida, dolor torcico o dificultad para respirar.  Infeccin en los huesos, las articulaciones, la piel o los tejidos blandos. Cmo se diagnostica? Le pueden realizar exmenes de deteccin de EGB entre la semana 35 y la semana 37 de gestacin. Si tiene sntomas de trabajo de Sport and exercise psychologist, Fish farm manager los exmenes de deteccin antes. Esta afeccin se diagnostica a travs de los [place] de [place] de laboratorio de:  Un hisopado del lquido de la vagina y del recto.  Lauris Poag de [place]. Cmo se trata? Esta afeccin se trata con un antibitico. Le pueden administrar antibiticos:  Al comenzar el trabajo de parto o apenas se rompa la bolsa de aguas. El uso de los medicamentos continuar hasta despus del [place]. Si tiene un parto por cesrea no necesita antibiticos, salvo que se haya roto la bolsa de [place].  Para el beb, si necesita tratamiento. El mdico controlar al beb para decidir si necesita antibiticos a fin de prevenir una infeccin grave. Siga estas instrucciones en su casa:  Tome los medicamentos de venta libre y los recetados solamente como se lo haya indicado el mdico.  Tome su antibitico como se lo haya indicado el mdico. No deje de tomar el antibitico aunque comience a sentirse mejor.  Concurra a todas las visitas previas al parto (prenatales) y las visitas de control como se lo haya indicado el mdico. Esto es importante. Comunquese con un mdico si:  Siente dolor o ardor al Geographical information systems officer.  Tiene que orinar con ms frecuencia de lo habitual.  Tiene fiebre o escalofros.  Tiene una secrecin vaginal con mal olor. Solicite ayuda de inmediato si:  Rompe la bolsa.  Comienza el trabajo de [place].  Siente un dolor intenso en el abdomen.  Tiene dificultad para respirar.  Siente dolor en  el pecho. Estos sntomas pueden representar un problema grave que constituye Radio broadcast assistant. No espere a ver si los sntomas desaparecen. Solicite atencin mdica de inmediato. Comunquese con el servicio de emergencias de su localidad (911 en los [place]). No conduzca por sus propios medios [employer].  Resumen  El EGB es un tipo de bacteria que se encuentra con frecuencia en personas sanas.  Durante el embarazo, la colonizacin con EGB puede causar complicaciones graves para usted o el beb.  El mdico la examinar entre la semana 35 y 37 de embarazo para Chief Strategy Officerdeterminar si tiene Theatre managercolonizacin de EGB.  Si esto sucede Academic librariandurante el embarazo, el mdico le recomendar antibiticos a travs de una va intravenosa durante el Springvilletrabajo de Barksdaleparto.  Despus del parto, se evaluar al beb para detectar complicaciones relacionadas con una posible infeccin por EGB, lo cual puede requerir antibiticos para prevenir una infeccin grave. Esta informacin no tiene Theme park managercomo fin reemplazar el consejo del mdico. Asegrese de hacerle al mdico cualquier pregunta que tenga. Document Revised: 05/10/2019 Document Reviewed: 05/10/2019 Elsevier Patient Education  2020 Elsevier Inc.   Southern CompanyEleccin del mtodo anticonceptivo Contraception Choices La anticoncepcin, o los mtodos anticonceptivos, hace referencia a los mtodos o dispositivos que evitan el Medicine Lodgeembarazo. Mtodos hormonales Implante anticonceptivo  Un implante anticonceptivo consiste en un tubo plstico delgado que contiene una hormona. Se inserta en la parte superior del brazo. Puede Consulting civil engineerpermanecer en el lugar hasta por 3 aos. Inyecciones de progestina sola Las inyecciones de progestina sola contienen progestina, una forma sinttica de la hormona progesterona. Un mdico las administra cada 3 meses. Pldoras anticonceptivas  Las pldoras anticonceptivas son pastillas que contienen hormonas que evitan el Holladayembarazo. Deben tomarse una vez al da, preferentemente a  la misma Economisthora cada da. Parches anticonceptivos  El parche anticonceptivo contiene hormonas que evitan el Lancasterembarazo. Se coloca en la piel, debe cambiarse una vez a la semana durante tres semanas y debe retirarse en la cuarta semana. Se necesita una receta para utilizar este mtodo anticonceptivo. Anillo vaginal  Un anillo vaginal contiene hormonas que evitan el embarazo. Se coloca en la vagina durante tres semanas y se retira en la cuarta semana. Luego se repite el proceso con un anillo nuevo. Se necesita una receta para utilizar este mtodo anticonceptivo. Anticonceptivo de emergencia Los anticonceptivos de emergencia son mtodos para evitar un embarazo despus de Warehouse managertener sexo sin proteccin. Vienen en forma de pldora y pueden tomarse hasta 5 das despus de Harrisonburgtener sexo. Funcionan mejor cuando se toman lo ms pronto posible luego de eBaytener sexo. La mayora de los anticonceptivos de emergencia estn disponibles sin receta mdica. Este mtodo no debe utilizarse como el nico mtodo anticonceptivo. Mtodos de barrera Preservativo masculino  Un preservativo masculino es una vaina delgada que se coloca sobre el pene durante el sexo. Los preservativos evitan que el esperma ingrese en el cuerpo de la [place]mujer. Pueden utilizarse con un espermicida para aumentar la efectividad. Deben desecharse luego de su uso. Preservativo femenino  Un preservativo femenino es una vaina blanda y holgada que se coloca en la vagina antes de Birch Rivertener sexo. El preservativo evita que el esperma ingrese en el cuerpo de la [place]mujer. Deben desecharse luego de su uso. Diafragma  Un diafragma es una barrera blanda con forma de cpula. Se inserta en la vagina antes del sexo, junto con un espermicida. El diafragma bloquea el ingreso de esperma en el tero, y el espermicida mata a los espermatozoides. El Designer, fashion/clothingdiafragma debe permanecer en la vagina durante 6 a 8 horas despus de Warehouse managertener sexo y debe retirarse en el plazo de las 24 horas. Un diafragma es  recetado y colocado por un mdico. Debe reemplazarse cada 1 a 2 aos, despus de dar a luz, de aumentar ms de 15lb (6,8kg) y de [place] and Herzegovinauna ciruga plvica.  Capuchn cervical  Un capuchn cervical es una copa redonda y blanda de ltex o plstico que se coloca en el cuello uterino. Se inserta en la vagina antes del sexo, junto con un espermicida. Bloquea el ingreso del esperma en el tero. El capuchn [name] durante 6 a 8 horas despus de Warehouse manager sexo y debe retirarse en el plazo de las 48 horas. Un capuchn cervical debe ser recetado y colocado por un mdico. Debe reemplazarse cada 2aos. Esponja  Una esponja es una pieza blanda y circular de espuma de poliuretano que contiene espermicida. La esponja ayuda a bloquear el ingreso de esperma en el tero, y el espermicida mata a los espermatozoides. Belva Bertin, debe humedecerla e insertarla en la vagina. Debe insertarse antes de eBay, debe permanecer dentro al menos durante 6 horas despus de tener sexo y debe retirarse y Nurse, adult en el plazo de las 30 horas. Espermicidas Los espermicidas son sustancias qumicas que matan o bloquean al esperma y no lo dejan ingresar al cuello uterino y al tero. Vienen en forma de crema, gel, supositorio, espuma o comprimido. Un espermicida debe insertarse en la vagina con un aplicador al menos 10 o 15 minutos antes de tener sexo para dar tiempo a que surta [place]. El proceso debe repetirse cada vez que tenga sexo. Los espermicidas no requieren Emergency planning/management officer. Anticonceptivos intrauterinos Dispositivo intrauterino (DIU). Un DIU es un dispositivo en forma de T que se coloca en el tero. Existen dos tipos:  DIU hormonal.Este tipo contiene progestina, una forma sinttica de la hormona progesterona. Este tipo puede permanecer colocado durante 3 a 5 aos.  DIU de cobre.Este tipo est recubierto con un alambre de cobre. Puede permanecer colocado durante 10 aos.  Mtodos anticonceptivos  permanentes Ligadura de trompas en la mujer En este mtodo, se sellan, atan u obstruyen las trompas de [place]opio durante una ciruga para Automotive engineer que el vulo descienda [place]. Esterilizacin histeroscpica En este mtodo, se coloca un implante pequeo y flexible dentro de cada trompa de Falopio. Los implantes hacen que se forme un tejido cicatricial en las trompas de [place] y que las obstruya para que el espermatozoide no pueda llegar al vulo. El procedimiento demora alrededor de 3 meses para que sea [place]. Debe utilizarse otro mtodo anticonceptivo durante esos 3 meses. Esterilizacin masculina Este es un procedimiento que consiste en atar los conductos que transportan el esperma (vasectoma). Luego del procedimiento, elfile://[email or web address] hombre puede eyacular lquido (semen). Mtodos de planificacin natural Planificacin familiar natural En este mtodo, la pareja no tiene [employer] la mujer podra quedar [place]. Mtodo calendario Marketing executive un seguimiento de la duracin de cada ciclo menstrual, identificar los [employer] que se puede producir un Psychiatrist y no Warehouse manager sexo durante esos [place]. Mtodo de la ovulacin En este mtodo, la pareja evita tener sexo durante la ovulacin. Mtodo sintotrmico Este mtodo implica no tener sexo durante la ovulacin. Normalmente, la mujer comprueba la ovulacin al observar cambios en su temperatura y en la consistencia del moco cervical. Mtodo posovulacin En este mtodo, la pareja espera a que finalice la ovulacin para Doctor, hospital. Resumen  La anticoncepcin, o los mtodos anticonceptivos, hace referencia a los mtodos o dispositivos que evitan el [facility] [place].  Los mtodos anticonceptivos hormonales incluyen implantes, inyecciones, pastillas, parches, anillos vaginales y anticonceptivos de Associate Professor.  Los mtodos anticonceptivos de barrera pueden  incluir preservativos masculinos, preservativos femeninos, diafragmas, capuchones cervicales, esponjas y espermicidas.  [employer] tipos  de DIU (dispositivo intrauterino). Un DIU puede colocarse en el tero de una mujer para evitar el embarazo durante 3 a 5 aos.  La esterilizacin permanente puede realizarse mediante un procedimiento para hombres, mujeres o ambos.  Los [employer] de Medical sales representative natural incluyen no tener [employer] la mujer podra quedar [place]. Esta informacin no tiene Theme park manager el consejo del mdico. Asegrese de hacerle al mdico cualquier pregunta que tenga. Document Revised: 09/25/2017 Document Reviewed: 12/14/2016 Elsevier Patient Education  2020 [employer].  History

## 2020-07-19 NOTE — Progress Notes (Signed)
   Subjective:  Kristine Bryant is a 34 y.o. G1P0000 at [redacted]w[redacted]d being seen today for ongoing prenatal care.  She is currently monitored for the following issues for this high-risk pregnancy and has Supervision of other normal pregnancy, antepartum; Group B streptococcus urinary tract infection affecting pregnancy; Carpal tunnel syndrome during pregnancy; Localized swelling of both lower legs; Gestational diabetes; and Language barrier on their problem list.  Patient reports contractions since 1930.  Contractions: Irregular. Vag. Bleeding: None.  Movement: Present. Denies leaking of fluid.   Reports contractions every half hour since 1930 last night Some mild leaking of fluid but no big gush No vaginal bleeding Denies headache, vision changes, chest pain, SOB, RUQ pain. Mild LE edema  The following portions of the patient's history were reviewed and updated as appropriate: allergies, current medications, past family history, past medical history, past social history, past surgical history and problem list. Problem list updated.  Objective:   Vitals:   07/19/20 1022 07/19/20 1024  BP: (!) 154/106 (!) 147/98  Pulse: (!) 106 99  Weight: 206 lb 3.2 oz (93.5 kg)     Fetal Status: Fetal Heart Rate (bpm): 161   Movement: Present     General:  Alert, oriented and cooperative. Patient is in no acute distress.  Skin: Skin is warm and dry. No rash noted.   Cardiovascular: Normal heart rate noted  Respiratory: Normal respiratory effort, no problems with respiration noted  Abdomen: Soft, gravid, appropriate for gestational age. Pain/Pressure: Present     Pelvic: Vag. Bleeding: None Vag D/C Character: Watery   Cervical exam deferred        Extremities: Normal range of motion.  Edema: None  Mental Status: Normal mood and affect. Normal behavior. Normal judgment and thought content.   Urinalysis:      Assessment and Plan:  Pregnancy: G1P0000 at [redacted]w[redacted]d  1. Supervision of other normal  pregnancy, antepartum BP elevated x2 at clinic today, reports no symptoms Given report of leaking and elevated BP's here will send to MAU for rule out rupture and PreE eval - GC/Chlamydia probe amp ([place])not at [facility]  2. Language barrier Visit conducted in Spanish by certified provider  3. Group B Streptococcus urinary tract infection affecting pregnancy in third trimester Prophylaxis in labor  4. Gestational diabetes mellitus (GDM), antepartum, gestational diabetes method of control unspecified Sugars are 100% controlled Repeat growth and BPP done today, results pending  Preterm labor symptoms and general obstetric precautions including but not limited to vaginal bleeding, contractions, leaking of fluid and fetal movement were reviewed in detail with the patient. Please refer to After Visit Summary for other counseling recommendations.  Return in 1 week (on 07/26/2020) for [facility]   Venora Maples, MD

## 2020-07-19 NOTE — MAU Note (Addendum)
Pt was sent from office for BP check and eval of possible SROM and cntrx. Says discharges seems the same as before, just increased quantity.   Denies VB, states decreased fetal movement. Denies headache and vision changes. Endorses some pain under ribs. Denies increased swelling.

## 2020-07-19 NOTE — Progress Notes (Signed)
Pt given fetal movement clicker.

## 2020-07-22 ENCOUNTER — Ambulatory Visit (INDEPENDENT_AMBULATORY_CARE_PROVIDER_SITE_OTHER): Payer: Self-pay | Admitting: *Deleted

## 2020-07-22 ENCOUNTER — Other Ambulatory Visit: Payer: Self-pay

## 2020-07-22 ENCOUNTER — Encounter: Payer: Self-pay | Admitting: *Deleted

## 2020-07-22 VITALS — BP 129/89 | HR 96 | Ht 63.78 in | Wt 206.3 lb

## 2020-07-22 DIAGNOSIS — Z0130 Encounter for examination of blood pressure without abnormal findings: Secondary | ICD-10-CM

## 2020-07-22 LAB — GC/CHLAMYDIA PROBE AMP (~~LOC~~) NOT AT ARMC
Chlamydia: NEGATIVE
Comment: NEGATIVE
Comment: NORMAL
Neisseria Gonorrhea: NEGATIVE

## 2020-07-22 NOTE — Progress Notes (Signed)
Interpreter [employer] present for encounter. Pt reports intermittent blurry vision and dizziness - none @ present. Last episode of blurry vision/dizziness was yesterday morning. She denies H/A. BP today - 144/87, P - 96. Pt states that when she felt dizzy her blood sugar was 97. Repeat BP after 20 minutes was 129/89, P - 96. Consult with Dr. Donavan Foil who recommends repeat BP tomorrow. If still elevated or worsening pre-e sx, consider direct admit for IOL. Pt was informed of plan of care. She voiced understanding and agrees to return to office tomorrow.

## 2020-07-22 NOTE — Progress Notes (Signed)
Patient was assessed and managed by nursing staff during this encounter. I have reviewed the chart and agree with the documentation and plan. I have also made any necessary editorial changes.  Warden Fillers, MD 07/22/2020 1:10 PM

## 2020-07-23 ENCOUNTER — Other Ambulatory Visit: Payer: Self-pay | Admitting: Women's Health

## 2020-07-23 ENCOUNTER — Ambulatory Visit (INDEPENDENT_AMBULATORY_CARE_PROVIDER_SITE_OTHER): Payer: Self-pay | Admitting: Family Medicine

## 2020-07-23 ENCOUNTER — Other Ambulatory Visit: Payer: Self-pay | Admitting: Family Medicine

## 2020-07-23 ENCOUNTER — Encounter: Payer: Self-pay | Admitting: *Deleted

## 2020-07-23 ENCOUNTER — Ambulatory Visit (INDEPENDENT_AMBULATORY_CARE_PROVIDER_SITE_OTHER): Payer: Self-pay | Admitting: *Deleted

## 2020-07-23 ENCOUNTER — Encounter: Payer: Self-pay | Admitting: Family Medicine

## 2020-07-23 VITALS — BP 123/83 | HR 109 | Ht 63.0 in | Wt 206.0 lb

## 2020-07-23 VITALS — BP 121/86 | HR 110 | Wt 209.7 lb

## 2020-07-23 DIAGNOSIS — O133 Gestational [pregnancy-induced] hypertension without significant proteinuria, third trimester: Secondary | ICD-10-CM

## 2020-07-23 DIAGNOSIS — Z0130 Encounter for examination of blood pressure without abnormal findings: Secondary | ICD-10-CM

## 2020-07-23 DIAGNOSIS — Z3480 Encounter for supervision of other normal pregnancy, unspecified trimester: Secondary | ICD-10-CM

## 2020-07-23 DIAGNOSIS — O1493 Unspecified pre-eclampsia, third trimester: Secondary | ICD-10-CM

## 2020-07-23 DIAGNOSIS — B951 Streptococcus, group B, as the cause of diseases classified elsewhere: Secondary | ICD-10-CM

## 2020-07-23 DIAGNOSIS — O2343 Unspecified infection of urinary tract in pregnancy, third trimester: Secondary | ICD-10-CM

## 2020-07-23 DIAGNOSIS — Z3A37 37 weeks gestation of pregnancy: Secondary | ICD-10-CM

## 2020-07-23 DIAGNOSIS — Z789 Other specified health status: Secondary | ICD-10-CM

## 2020-07-23 DIAGNOSIS — O24410 Gestational diabetes mellitus in pregnancy, diet controlled: Secondary | ICD-10-CM

## 2020-07-23 NOTE — Patient Instructions (Addendum)

## 2020-07-23 NOTE — Progress Notes (Signed)
PRENATAL VISIT NOTE  Subjective:  Kristine Bryant is a 34 y.o. G1P0000 at [redacted]w[redacted]d being seen today for ongoing prenatal care.  She is currently monitored for the following issues for this high-risk pregnancy and has Supervision of other normal pregnancy, antepartum; Group B streptococcus urinary tract infection affecting pregnancy; Carpal tunnel syndrome during pregnancy; Localized swelling of both lower legs; Gestational diabetes; Language barrier; and Preeclampsia on their problem list.  Patient reports no complaints.  Contractions: Irregular. Vag. Bleeding: None.  Movement: Present. Denies leaking of fluid.   The following portions of the patient's history were reviewed and updated as appropriate: allergies, current medications, past family history, past medical history, past social history, past surgical history and problem list. Problem list updated.  Objective:   Vitals:   07/23/20 1329  BP: 121/86  Pulse: (!) 110  Weight: 209 lb 11.2 oz (95.1 kg)    Fetal Status: Fetal Heart Rate (bpm): NST   Movement: Present     General:  Alert, oriented and cooperative. Patient is in no acute distress.  Skin: Skin is warm and dry. No rash noted.   Cardiovascular: Normal heart rate noted  Respiratory: Normal respiratory effort, no problems with respiration noted  Abdomen: Soft, gravid, appropriate for gestational age.  Pain/Pressure: Present     Pelvic: Cervical exam performed        Extremities: Normal range of motion.  Edema: None  Mental Status:  Normal mood and affect. Normal behavior. Normal judgment and thought content.  Procedure: Patient informed of R/B/A of procedure. NST was performed and was reactive prior to procedure. Procedure done to begin ripening of the cervix prior to admission for induction of labor. Appropriate time out taken. The patient was placed in the lithotomy position and a manual cervical exam demonstrated a closed and very posterior cervix. At this point a  speculum was inserted, the cervix was brough into view and noted to be closed and long visually, and a ring forceps was used to guide the 70F foley balloon through the internal os of the cervix. Foley Balloon filled with 30cc of sterile water. Plug inserted into end of the foley. Foley placed on tension and taped to medical thigh. NST:  Baseline: 150 bpm, Variability: Good {> 6 bpm), Accelerations: Reactive and Decelerations: Absent there were no signs of tachysystole or hypertonus. All equipment was removed and accounted for. The patient tolerated the procedure well.  Assessment and Plan:  Pregnancy: G1P0000 at [redacted]w[redacted]d 1. Supervision of other normal pregnancy, antepartum BP and FHT normal today Scheduled for IOL tomorrrow morning Cervix C/L/-2, FB placed with 30cc and put on traction. NST reactive before and after procedure  2. Gestational hypertension, third trimester - Fetal nonstress test  3. Group B Streptococcus urinary tract infection affecting pregnancy in third trimester Penicillin in labor  4. Diet controlled gestational diabetes mellitus (GDM), antepartum Reports sugars well controlled  5. Pre-eclampsia in third trimester BP normal today but multiple mild range BP's recorded over past several visits and significant proteinuria of 1.92 on last check Scheduled for IOL for tomorrow morning at 0700  6. Language barrier Seen by certified Spanish speaking provider   S/p Outpatient placement of foley balloon catheter for cervical ripening. Induction of labor scheduled for tomorrow at 0700 am. Reassuring FHR tracing with no concerns at present. Warning signs given to patient to include return to MAU for heavy vaginal bleeding, Rupture of membranes, painful uterine contractions q 5 mins or less, severe abdominal discomfort, decreased fetal  movement.  Return in 6 weeks (on 09/03/2020) for PP check.   Venora Maples, MD 07/23/2020 1:55 PM

## 2020-07-23 NOTE — Progress Notes (Signed)
Interpreter Maria present for encounter 

## 2020-07-23 NOTE — Progress Notes (Addendum)
Video interpreter Vernona Rieger [phone] used for encounter. Pt presented for BP check - 123/83, P - 109. She denied H/A or visual disturbances since visit yesterday. Pt status reviewed with Dr. Debroah Loop who recommends foley bulb insertion today and IOL tomorrow. Pt was provided with this information and brief explanation of foley bulb was given. She agrees to plan of care and will return today @ 1315.

## 2020-07-24 ENCOUNTER — Encounter: Payer: Self-pay | Admitting: *Deleted

## 2020-07-24 ENCOUNTER — Inpatient Hospital Stay (HOSPITAL_COMMUNITY): Payer: Medicaid Other | Admitting: Anesthesiology

## 2020-07-24 ENCOUNTER — Other Ambulatory Visit: Payer: Self-pay

## 2020-07-24 ENCOUNTER — Inpatient Hospital Stay (HOSPITAL_COMMUNITY): Payer: Medicaid Other

## 2020-07-24 ENCOUNTER — Encounter (HOSPITAL_COMMUNITY): Payer: Self-pay | Admitting: Obstetrics and Gynecology

## 2020-07-24 ENCOUNTER — Inpatient Hospital Stay (HOSPITAL_COMMUNITY)
Admission: AD | Admit: 2020-07-24 | Discharge: 2020-07-26 | DRG: 807 | Disposition: A | Discharge status: Home / Self Care | Payer: Medicaid Other | Attending: Obstetrics and Gynecology | Admitting: Obstetrics and Gynecology

## 2020-07-24 DIAGNOSIS — Z20822 Contact with and (suspected) exposure to covid-19: Secondary | ICD-10-CM | POA: Diagnosis present

## 2020-07-24 DIAGNOSIS — O1490 Unspecified pre-eclampsia, unspecified trimester: Secondary | ICD-10-CM | POA: Diagnosis present

## 2020-07-24 DIAGNOSIS — Z3A37 37 weeks gestation of pregnancy: Secondary | ICD-10-CM

## 2020-07-24 DIAGNOSIS — Z3480 Encounter for supervision of other normal pregnancy, unspecified trimester: Secondary | ICD-10-CM

## 2020-07-24 DIAGNOSIS — O24420 Gestational diabetes mellitus in childbirth, diet controlled: Secondary | ICD-10-CM | POA: Diagnosis present

## 2020-07-24 DIAGNOSIS — Z603 Acculturation difficulty: Secondary | ICD-10-CM | POA: Diagnosis present

## 2020-07-24 DIAGNOSIS — Z23 Encounter for immunization: Secondary | ICD-10-CM

## 2020-07-24 DIAGNOSIS — Z8632 Personal history of gestational diabetes: Secondary | ICD-10-CM | POA: Diagnosis present

## 2020-07-24 DIAGNOSIS — O99824 Streptococcus B carrier state complicating childbirth: Secondary | ICD-10-CM | POA: Diagnosis present

## 2020-07-24 DIAGNOSIS — Z8619 Personal history of other infectious and parasitic diseases: Secondary | ICD-10-CM | POA: Diagnosis present

## 2020-07-24 DIAGNOSIS — B951 Streptococcus, group B, as the cause of diseases classified elsewhere: Secondary | ICD-10-CM

## 2020-07-24 DIAGNOSIS — O1404 Mild to moderate pre-eclampsia, complicating childbirth: Principal | ICD-10-CM | POA: Diagnosis present

## 2020-07-24 DIAGNOSIS — Z8759 Personal history of other complications of pregnancy, childbirth and the puerperium: Secondary | ICD-10-CM | POA: Diagnosis not present

## 2020-07-24 DIAGNOSIS — Z789 Other specified health status: Secondary | ICD-10-CM | POA: Diagnosis present

## 2020-07-24 DIAGNOSIS — O24419 Gestational diabetes mellitus in pregnancy, unspecified control: Secondary | ICD-10-CM | POA: Diagnosis present

## 2020-07-24 DIAGNOSIS — O24439 Gestational diabetes mellitus in the puerperium, unspecified control: Secondary | ICD-10-CM | POA: Diagnosis not present

## 2020-07-24 HISTORY — DX: Gestational (pregnancy-induced) hypertension without significant proteinuria, unspecified trimester: O13.9

## 2020-07-24 LAB — COMPREHENSIVE METABOLIC PANEL
ALT: 10 U/L (ref 0–44)
AST: 21 U/L (ref 15–41)
Albumin: 2.7 g/dL — ABNORMAL LOW (ref 3.5–5.0)
Alkaline Phosphatase: 116 U/L (ref 38–126)
Anion gap: 12 (ref 5–15)
BUN: <5 mg/dL — ABNORMAL LOW (ref 6–20)
CO2: 19 mmol/L — ABNORMAL LOW (ref 22–32)
Calcium: 9.1 mg/dL (ref 8.9–10.3)
Chloride: 105 mmol/L (ref 98–111)
Creatinine, Ser: 0.61 mg/dL (ref 0.44–1.00)
GFR, Estimated: >60 mL/min (ref >60)
Glucose, Bld: 136 mg/dL — ABNORMAL HIGH (ref 70–99)
Potassium: 3.5 mmol/L (ref 3.5–5.1)
Sodium: 136 mmol/L (ref 135–145)
Total Bilirubin: 0.3 mg/dL (ref 0.3–1.2)
Total Protein: 6.1 g/dL — ABNORMAL LOW (ref 6.5–8.1)

## 2020-07-24 LAB — RESPIRATORY PANEL BY RT PCR (FLU A&B, COVID)
Influenza A by PCR: NEGATIVE
Influenza B by PCR: NEGATIVE
SARS Coronavirus 2 by RT PCR: NEGATIVE

## 2020-07-24 LAB — CBC
HCT: 38.2 % (ref 36.0–46.0)
HCT: 38.4 % (ref 36.0–46.0)
Hemoglobin: 12.5 g/dL (ref 12.0–15.0)
Hemoglobin: 13.1 g/dL (ref 12.0–15.0)
MCH: 29.8 pg (ref 26.0–34.0)
MCH: 30.8 pg (ref 26.0–34.0)
MCHC: 32.7 g/dL (ref 30.0–36.0)
MCHC: 34.1 g/dL (ref 30.0–36.0)
MCV: 91.2 fL (ref 80.0–100.0)
MCV: 90.4 fL (ref 80.0–100.0)
Platelets: 193 10*3/uL (ref 150–400)
Platelets: 190 10*3/uL (ref 150–400)
RBC: 4.19 MIL/uL (ref 3.87–5.11)
RBC: 4.25 MIL/uL (ref 3.87–5.11)
RDW: 13.5 % (ref 11.5–15.5)
RDW: 13.5 % (ref 11.5–15.5)
WBC: 9.9 10*3/uL (ref 4.0–10.5)
WBC: 13.9 10*3/uL — ABNORMAL HIGH (ref 4.0–10.5)
nRBC: 0 % (ref 0.0–0.2)
nRBC: 0 % (ref 0.0–0.2)

## 2020-07-24 LAB — PROTEIN / CREATININE RATIO, URINE
Creatinine, Urine: 324.18 mg/dL
Protein Creatinine Ratio: 0.55 mg/mg{Cre} — ABNORMAL HIGH (ref 0.00–0.15)
Total Protein, Urine: 178 mg/dL

## 2020-07-24 LAB — GLUCOSE, CAPILLARY
Glucose-Capillary: 145 mg/dL — ABNORMAL HIGH (ref 70–99)
Glucose-Capillary: 137 mg/dL — ABNORMAL HIGH (ref 70–99)
Glucose-Capillary: 106 mg/dL — ABNORMAL HIGH (ref 70–99)
Glucose-Capillary: 82 mg/dL (ref 70–99)
Glucose-Capillary: 97 mg/dL (ref 70–99)
Glucose-Capillary: 105 mg/dL — ABNORMAL HIGH (ref 70–99)
Glucose-Capillary: 112 mg/dL — ABNORMAL HIGH (ref 70–99)

## 2020-07-24 LAB — TYPE AND SCREEN
ABO/RH(D): O POS
Antibody Screen: NEGATIVE

## 2020-07-24 LAB — RPR: RPR Ser Ql: NONREACTIVE

## 2020-07-24 MED ORDER — OXYTOCIN-SODIUM CHLORIDE 30-0.9 UT/500ML-% IV SOLN: 2.5000 [IU]/h | INTRAVENOUS | Status: DC

## 2020-07-24 MED ORDER — LACTATED RINGERS AMNIOINFUSION: INTRAVENOUS | Status: DC

## 2020-07-24 MED ORDER — LACTATED RINGERS IV SOLN: 500.0000 mL | Freq: Once | INTRAVENOUS | Status: DC

## 2020-07-24 MED ORDER — LIDOCAINE HCL (PF) 1 % IJ SOLN
INTRAMUSCULAR | Status: DC | PRN
  Administered 2020-07-24: 2 mL via EPIDURAL
  Administered 2020-07-24: 3 mL via EPIDURAL
  Administered 2020-07-24: 5 mL via EPIDURAL

## 2020-07-24 MED ORDER — PHENYLEPHRINE 40 MCG/ML (10ML) SYRINGE FOR IV PUSH (FOR BLOOD PRESSURE SUPPORT): 80.0000 ug | PREFILLED_SYRINGE | INTRAVENOUS | Status: DC | PRN

## 2020-07-24 MED ORDER — AMMONIA AROMATIC IN INHA
RESPIRATORY_TRACT | Status: AC
  Filled 2020-07-24: qty 10

## 2020-07-24 MED ORDER — TERBUTALINE SULFATE 1 MG/ML IJ SOLN: 0.2500 mg | Freq: Once | INTRAMUSCULAR | Status: DC | PRN

## 2020-07-24 MED ORDER — MISOPROSTOL 25 MCG QUARTER TABLET: 25.0000 ug | ORAL_TABLET | ORAL | Status: DC | PRN

## 2020-07-24 MED ORDER — PENICILLIN G POT IN DEXTROSE 60000 UNIT/ML IV SOLN
3.0000 10*6.[IU] | INTRAVENOUS | Status: DC
  Administered 2020-07-24 (×3): 3 10*6.[IU] via INTRAVENOUS
  Filled 2020-07-24 (×3): qty 50

## 2020-07-24 MED ORDER — ONDANSETRON HCL 4 MG/2ML IJ SOLN: 4.0000 mg | Freq: Four times a day (QID) | INTRAMUSCULAR | Status: DC | PRN

## 2020-07-24 MED ORDER — LACTATED RINGERS IV SOLN: INTRAVENOUS | Status: DC

## 2020-07-24 MED ORDER — OXYCODONE-ACETAMINOPHEN 5-325 MG PO TABS: 1.0000 | ORAL_TABLET | ORAL | Status: DC | PRN

## 2020-07-24 MED ORDER — FLEET ENEMA 7-19 GM/118ML RE ENEM: 1.0000 | ENEMA | RECTAL | Status: DC | PRN

## 2020-07-24 MED ORDER — SOD CITRATE-CITRIC ACID 500-334 MG/5ML PO SOLN
30.0000 mL | ORAL | Status: DC | PRN
  Administered 2020-07-24: 30 mL via ORAL
  Filled 2020-07-24: qty 15

## 2020-07-24 MED ORDER — LABETALOL HCL 5 MG/ML IV SOLN: 80.0000 mg | INTRAVENOUS | Status: DC | PRN

## 2020-07-24 MED ORDER — OXYTOCIN BOLUS FROM INFUSION: 333.0000 mL | Freq: Once | INTRAVENOUS | Status: DC

## 2020-07-24 MED ORDER — LIDOCAINE HCL (PF) 1 % IJ SOLN: 30.0000 mL | INTRAMUSCULAR | Status: DC | PRN

## 2020-07-24 MED ORDER — SODIUM CHLORIDE (PF) 0.9 % IJ SOLN
INTRAMUSCULAR | Status: DC | PRN
  Administered 2020-07-24: 12 mL/h via EPIDURAL

## 2020-07-24 MED ORDER — LABETALOL HCL 5 MG/ML IV SOLN: 40.0000 mg | INTRAVENOUS | Status: DC | PRN

## 2020-07-24 MED ORDER — EPHEDRINE 5 MG/ML INJ: 10.0000 mg | INTRAVENOUS | Status: DC | PRN

## 2020-07-24 MED ORDER — OXYCODONE-ACETAMINOPHEN 5-325 MG PO TABS: 2.0000 | ORAL_TABLET | ORAL | Status: DC | PRN

## 2020-07-24 MED ORDER — LABETALOL HCL 5 MG/ML IV SOLN: 20.0000 mg | INTRAVENOUS | Status: DC | PRN

## 2020-07-24 MED ORDER — FENTANYL CITRATE (PF) 100 MCG/2ML IJ SOLN
100.0000 ug | INTRAMUSCULAR | Status: DC | PRN
  Administered 2020-07-24 (×2): 100 ug via INTRAVENOUS
  Filled 2020-07-24 (×2): qty 2

## 2020-07-24 MED ORDER — HYDROXYZINE HCL 50 MG PO TABS: 50.0000 mg | ORAL_TABLET | Freq: Four times a day (QID) | ORAL | Status: DC | PRN

## 2020-07-24 MED ORDER — FENTANYL-BUPIVACAINE-NACL 0.5-0.125-0.9 MG/250ML-% EP SOLN: 12.0000 mL/h | EPIDURAL | Status: DC | PRN

## 2020-07-24 MED ORDER — LACTATED RINGERS IV SOLN
500.0000 mL | INTRAVENOUS | Status: DC | PRN
  Administered 2020-07-24: 500 mL via INTRAVENOUS

## 2020-07-24 MED ORDER — HYDRALAZINE HCL 20 MG/ML IJ SOLN: 10.0000 mg | INTRAMUSCULAR | Status: DC | PRN

## 2020-07-24 MED ORDER — OXYTOCIN-SODIUM CHLORIDE 30-0.9 UT/500ML-% IV SOLN
1.0000 m[IU]/min | INTRAVENOUS | Status: DC
  Administered 2020-07-24: 2 m[IU]/min via INTRAVENOUS
  Administered 2020-07-24: 4 m[IU]/min via INTRAVENOUS
  Filled 2020-07-24: qty 500

## 2020-07-24 MED ORDER — ACETAMINOPHEN 325 MG PO TABS: 650.0000 mg | ORAL_TABLET | ORAL | Status: DC | PRN

## 2020-07-24 MED ORDER — DIPHENHYDRAMINE HCL 50 MG/ML IJ SOLN: 12.5000 mg | INTRAMUSCULAR | Status: DC | PRN

## 2020-07-24 MED ORDER — SODIUM CHLORIDE 0.9 % IV SOLN
5.0000 10*6.[IU] | Freq: Once | INTRAVENOUS | Status: AC
  Administered 2020-07-24: 5 10*6.[IU] via INTRAVENOUS
  Filled 2020-07-24: qty 5

## 2020-07-24 MED ORDER — FENTANYL-BUPIVACAINE-NACL 0.5-0.125-0.9 MG/250ML-% EP SOLN
12.0000 mL/h | EPIDURAL | Status: DC | PRN
  Filled 2020-07-24: qty 250

## 2020-07-24 NOTE — H&P (Signed)
OBSTETRIC ADMISSION HISTORY AND PHYSICAL  Kerensa Beyounce Dickens is a 34 y.o. female G1P0000 with IUP at [redacted]w[redacted]d by 7 week ultrasound presenting for IOL secondary to preeclampsia without severe features. She reports +FMs, No LOF, no VB, no blurry vision, headaches or peripheral edema, and RUQ pain.  She plans on breast and bottle feeding. She is unsure for birth control.  She received her prenatal care at [facility]   Dating: By 7 week ultrasound --->  Estimated Date of Delivery: 08/13/20  Sono: @[redacted]w[redacted]d , CWD, normal anatomy, cephalic presentation, 2519g, 06-03-1982 EFW  Prenatal History/Complications:  -Preeclampsia without severe features (UP:C 1.92 on 11/12) -A1GDM (1hr gtt 199, no f/u 3hr gtt) -EFW 9.7% on [facility] Anatomy Scan with inaccurate dating (based on inaccurate LMP and not early ultrasound)  Past Medical History: Past Medical History:  Diagnosis Date  . Diabetes mellitus without complication (HCC)    GDM    Past Surgical History: Past Surgical History:  Procedure Laterality Date  . APPENDECTOMY      Obstetrical History: OB History    Gravida  1   Para  0   Term  0   Preterm  0   AB  0   Living  0     SAB      TAB      Ectopic      Multiple      Live Births              Social History Social History   Socioeconomic History  . Marital status: Single    Spouse name: Not on file  . Number of children: Not on file  . Years of education: Not on file  . Highest education level: Not on file  Occupational History  . Not on file  Tobacco Use  . Smoking status: Never Smoker  . Smokeless tobacco: Never Used  Vaping Use  . Vaping Use: Never used  Substance and Sexual Activity  . Alcohol use: Not on file    Comment: SOCIAL- LITTLE  . Drug use: Never  . Sexual activity: Yes  Other Topics Concern  . Not on file  Social History Narrative   Moved from [place] in 2021    Social Determinants of Health   Financial Resource Strain:   . Difficulty of  Paying Living Expenses: Not on file  Food Insecurity: No Food Insecurity  . Worried About 2022 in the Last Year: Never true  . Ran Out of Food in the Last Year: Never true  Transportation Needs: No Transportation Needs  . Lack of Transportation (Medical): No  . Lack of Transportation (Non-Medical): No  Physical Activity:   . Days of Exercise per Week: Not on file  . Minutes of Exercise per Session: Not on file  Stress:   . Feeling of Stress : Not on file  Social Connections:   . Frequency of Communication with Friends and Family: Not on file  . Frequency of Social Gatherings with Friends and Family: Not on file  . Attends Religious Services: Not on file  . Active Member of Clubs or Organizations: Not on file  . Attends Programme researcher, broadcasting/film/video Meetings: Not on file  . Marital Status: Not on file    Family History: No family history on file.  Allergies: No Known Allergies  Medications Prior to Admission  Medication Sig Dispense Refill Last Dose  . calcium carbonate (TUMS - DOSED IN MG ELEMENTAL CALCIUM) 500 MG chewable tablet Chew 1 tablet  by mouth daily.     . Doxylamine-Pyridoxine 10-10 MG TBEC Take 1 pill at night for 3 days then increase to 2 pills at night for 3 days then can increase to 2 pills in the AM and 2 pills in PM (Patient not taking: Reported on 07/23/2020) 90 tablet 3   . famotidine (PEPCID) 20 MG tablet Take 1 tablet (20 mg total) by mouth 2 (two) times daily. (Patient not taking: Reported on 07/12/2020) 60 tablet 1   . metoCLOPramide (REGLAN) 10 MG tablet Take 1 tablet (10 mg total) by mouth 3 (three) times daily with meals. (Patient not taking: Reported on 07/23/2020) 90 tablet 1   . Prenatal Vit-Fe Fumarate-FA (PRENATAL MULTIVITAMIN) TABS tablet Take 1 tablet by mouth daily at 12 noon.        Review of Systems   All systems reviewed and negative except as stated in HPI  Last menstrual period 10/27/2019. General appearance: alert, cooperative and  appears stated age Lungs: normal WOB Heart: regular rate Abdomen: soft, non-tender Extremities: no sign of DVT Presentation: cephalic Fetal monitoringBaseline: 140 bpm, Variability: Good {> 6 bpm), Accelerations: Reactive and Decelerations: Absent Uterine activityFrequency: Every 2-4 minutes   Prenatal labs: ABO, Rh: O/Positive/-- (05/26 1125) Antibody: Negative (05/26 1125) Rubella: 11.50 (05/26 1125) RPR: Non Reactive (09/24 0919)  HBsAg: Negative (05/26 1125)  HIV: Non Reactive (09/24 0919)  GBS:  positive 1 hr Glucola 199 Genetic screening wnl Anatomy US @[facility] --wnl except for EFW 9.7% with inaccurate dating (based on inaccurate LMP and not early ultrasound)  Prenatal Transfer Tool  Maternal Diabetes: Yes:  Diabetes Type:  Diet controlled Genetic Screening: Normal Maternal Ultrasounds/Referrals: Normal Fetal Ultrasounds or other Referrals:  None Maternal Substance Abuse:  No Significant Maternal Medications:  None Significant Maternal Lab Results: Group B Strep positive  No results found for this or any previous visit (from the past 24 hour(s)).  Patient Active Problem List   Diagnosis Date Noted  . Preeclampsia 07/19/2020  . Gestational diabetes 06/12/2020  . Language barrier 06/12/2020  . Carpal tunnel syndrome during pregnancy 05/02/2020  . Localized swelling of both lower legs 05/02/2020  . Supervision of other normal pregnancy, antepartum 02/07/2020  . Group B streptococcus urinary tract infection affecting pregnancy 02/07/2020    Assessment/Plan:  Malillany Kazlauskas is a 34 y.o. G1P0000 at [redacted]w[redacted]d here for IOL secondary to preeclampsia without severe features.  #Labor: S/p outpatient FB placement on 11/16 at approximately 1300. FB out s/p arrival. Will start pitocin. #Pain: TBD. Pt reports desire to avoid epidural. #FWB:  Category 1 strip #ID: GBS positive > PCN on admission #MOF: breast #MOC: undecided #Circ: n/a #Preeclampsia without severe  features: UP:C 1.92 with unremarkable CMP and CBC on 11/12. Blood pressures mild range on admission. F/u Preeclampsia labs on admission. #A1GDM: continue q4hr BG checks in latent labor  Yanira Tolsma, 13/12, MD OB Fellow, Faculty Practice 07/24/2020 9:14 AM

## 2020-07-24 NOTE — Progress Notes (Addendum)
Labor Progress Note Kristine Bryant is a 34 y.o. G1P0000 at [redacted]w[redacted]d presented for IOL-preeclampsia without severe features.  S: Denies headaches and vision changes. Endorses dizziness most likely attributed to fentanyl administration given onset. Overall doing well.   O:  BP 124/81   Pulse 87   Temp (!) 97.4 F (36.3 C) (Oral)   Resp 20   Ht 5\' 3"  (1.6 m)   LMP 10/27/2019   BMI 37.15 kg/m  EFM: 140bpm/moderate variability/accels present/no decels Toco: q2-3 minutes  CVE: Dilation: 4 Effacement (%): 80, 90 Station: -2 Presentation: Vertex Exam by:: Dr. [email or web address]   A&P: 34 y.o. G1P0000 [redacted]w[redacted]d presented for IOL-PreE w/ SF.  #IOL: S/p FB outpatient (11/16 @1300 ), dislodged today at 0830. Pitocin started @0830 . Last check was 6cm, however found to be 4cm upon most recent check. Decision made to rupture membranes after risks/benefits discussed. S/p AROM with IUPC @1540 . Continue to monitor, titrate pitocin accordingly.  #Pain: fentanyl given, prn  #FWB: category 1 with adequate contractions #GBS positive, PCN, adequate ppx #MOF: breast #MOC: undecided, patient provided with counseling   -Preeclampsia without severe features: BP stable, no severe range pressures. Asymptomatic. P/C 0.55 (previously 1.92). Continue to monitor.  -A1GDM: Failed 1hr GTT outpatient, no follow up. EFW (2519g 15%ile, 07/19/20). Last BG check 106. On clear diet, avoid juice.   -Language barrier: Spanish speaking, use of video interpreter at bedside.   , DO 3:53 PM    GME ATTESTATION:  I saw and evaluated the patient. I agree with the findings and the plan of care as documented in the resident's note.  , MD OB Fellow, [facility] 07/24/2020 3:59 PM

## 2020-07-24 NOTE — Progress Notes (Signed)
Labor Progress Note Kristine Bryant is a 34 y.o. G1P0000 at [redacted]w[redacted]d presented for IOL-preeclampsia without severe features.  S: Endorsing pain, patient frequently moves in the bed to get comfortable. Overall doing well.   O:  BP 129/86   Pulse 85   Temp (!) 97.4 F (36.3 C) (Oral)   Resp 20   Ht 5\' 3"  (1.6 m)   LMP 10/27/2019   BMI 37.15 kg/m  EFM: 130bpm/moderate variability/accels present/variable deccels with contractions Toco: q2-3 minutes  CVE: Dilation: 4.5 Effacement (%): 90 Station: -2 Presentation: Vertex Exam by:: [name]   A&P: 34 y.o. G1P0000 [redacted]w[redacted]d presented for IOL-PreE w/ SF.  #IOL: S/p FB outpatient (11/16 @1300 ), dislodged today at 0830. Pitocin started @0830 . S/p AROM with IUPC @1540 . Given strip tracing, decision made to start amnioinfusion. Pitocin currently held at 10 cc/hr.  #Pain: Fentanyl previously given. Given consistent variable decelerations, discussed risks/benefits of epidural. Patient agreed to epidural.  #FWB: category 2 but overall reassuring, continuing to monitor  #GBS positive, PCN, adequate ppx #MOF: breast #MOC: undecided, patient provided with counseling   -Preeclampsia without severe features: BP stable, no severe range pressures. Asymptomatic. P/C 0.55 (previously 1.92). Continue to monitor.  -A1GDM: Failed 1hr GTT outpatient, no follow up. EFW (2519g 15%ile, 07/19/20). Last BG check 106. On clear diet, avoid juice.   -Language barrier: Spanish speaking, in-person interpreter at bedside.   , DO 5:26 PM

## 2020-07-24 NOTE — Progress Notes (Addendum)
LABOR PROGRESS NOTE  Kristine Bryant is a 34 y.o. G1P0000 at [redacted]w[redacted]d  admitted for IOL s/p Pre-E w/o SF.   Subjective: Doing well. Having some pelvic discomfort.   Objective: BP 134/87   Pulse (!) 109   Temp 98.3 F (36.8 C) (Oral)   Resp 18   Ht 5\' 3"  (1.6 m)   Wt 95.1 kg   LMP 10/27/2019   SpO2 99%   BMI 37.15 kg/m  or  Vitals:   07/24/20 2022 07/24/20 2030 07/24/20 2100 07/24/20 2130  BP:  130/82 (!) 131/93 134/87  Pulse:  100 (!) 104 (!) 109  Resp:      Temp:      TempSrc:      SpO2:      Weight: 95.1 kg     Height: 5\' 3"  (1.6 m)      Dilation: Lip/rim Effacement (%): 100 Station: Plus 1 Presentation: Vertex Exam by:: , rnc  FHT: baseline rate 135-140 bpm, moderate varibility, 15 x  15 acel, no decel Toco: 1-2 min  Assessment / Plan: 34 y.o. G1P0000 at [redacted]w[redacted]d here for IOL s/p Pre-E w/o SF.  Pre-E w/o SF BP wnl.  -Continue to monitor  A1GDM Most recent CBG 105 -CBG q2h  Labor: Progressing expectantly. S/p FB/Pit/AROM/IUPC. Amnioinfusion Fetal Wellbeing: Cat I Pain Control:  Epidural Anticipated MOD: Vaginal  Kristine Sharples Autry-Lott, DO 07/24/2020, 10:06 PM PGY-2, [facility]

## 2020-07-24 NOTE — Anesthesia Preprocedure Evaluation (Signed)
Anesthesia Evaluation  Patient identified by MRN, date of birth, ID band Patient awake    Reviewed: Allergy & Precautions, Patient's Chart, lab work & pertinent test results  Airway Mallampati: II       Dental   Pulmonary neg pulmonary ROS,    breath sounds clear to auscultation       Cardiovascular hypertension,  Rhythm:Regular Rate:Normal     Neuro/Psych    GI/Hepatic negative GI ROS, Neg liver ROS,   Endo/Other  diabetes, Gestational  Renal/GU negative Renal ROS     Musculoskeletal   Abdominal   Peds  Hematology negative hematology ROS (+)   Anesthesia Other Findings   Reproductive/Obstetrics (+) Pregnancy                             Anesthesia Physical Anesthesia Plan  ASA: III  Anesthesia Plan: Epidural   Post-op Pain Management:    Induction:   PONV Risk Score and Plan: Treatment may vary due to age or medical condition  Airway Management Planned: Natural Airway  Additional Equipment:   Intra-op Plan:   Post-operative Plan:   Informed Consent: I have reviewed the patients History and Physical, chart, labs and discussed the procedure including the risks, benefits and alternatives for the proposed anesthesia with the patient or authorized representative who has indicated his/her understanding and acceptance.       Plan Discussed with:   Anesthesia Plan Comments:         Anesthesia Quick Evaluation

## 2020-07-24 NOTE — Progress Notes (Addendum)
Labor Progress Note Kristine Bryant is a 34 y.o. G1P0000 at [redacted]w[redacted]d presented for IOL-preeclampsia without severe features.  S: Denies dizziness, vision changes and headache. Endorses a 7/10 pain with stronger contractions, denies wanting epidural at this time. Overall doing well.  O:  BP 139/89   Pulse (!) 102   Temp 98.2 F (36.8 C)   Resp 18   Ht 5\' 3"  (1.6 m)   LMP 10/27/2019   BMI 37.15 kg/m  EFM: 140bpm/moderate variability/accels present/no decels Toco: q2-5 minutes  CVE: Dilation: 4 Effacement (%): 60, 70 Station: -2 Presentation: Vertex Exam by:: [email or web address] RN   A&P: 34 y.o. G1P0000 [redacted]w[redacted]d presented for IOL-PreE w/ SF.  #IOL: S/p FB outpatient (11/16 @1300 ), dislodged today at 0830. Pitocin started @0830 , continue to titrate.  #Pain: PRN #FWB: category 1 #GBS positive, PCN, adequate ppx #MOF: breast #MOC: undecided, patient provided with counseling   -Preeclampsia without severe features: BP stable, no severe range pressures. Asymptomatic. P/C 0.55 (previously 1.92). Continue to monitor.  -A1GDM: Failed 1hr GTT outpatient, no follow up. EFW (2519g 15%ile, 07/19/20). Last BG check 137, recheck in 4 hours. Continue to monitor. On clear diet, avoid juice.   -Language barrier: Spanish speaking, in person interpreter at bedside.   , DO 12:10 PM   GME ATTESTATION:  I saw and evaluated the patient. I agree with the findings and the plan of care as documented in the resident's note.  05-05-1985, MD OB Fellow, Faculty [facility], Center for [facility] 07/24/2020 12:29 PM

## 2020-07-24 NOTE — Anesthesia Procedure Notes (Signed)
Epidural Patient location during procedure: OB  Staffing Anesthesiologist: Marcene Duos, MD Performed: anesthesiologist   Preanesthetic Checklist Completed: patient identified, IV checked, site marked, risks and benefits discussed, surgical consent, monitors and equipment checked, pre-op evaluation and timeout performed  Epidural Patient position: sitting Prep: DuraPrep and site prepped and draped Patient monitoring: continuous pulse ox and blood pressure Approach: midline Location: L3-L4 Injection technique: LOR air  Needle:  Needle type: Tuohy  Needle gauge: 17 G Needle length: 9 cm and 9 Needle insertion depth: 6 cm Catheter type: closed end flexible Catheter size: 19 Gauge Catheter at skin depth: 11 cm Test dose: negative  Assessment Events: blood not aspirated, injection not painful, no injection resistance, no paresthesia and negative IV test

## 2020-07-25 ENCOUNTER — Encounter: Payer: Self-pay | Admitting: Obstetrics & Gynecology

## 2020-07-25 ENCOUNTER — Encounter (HOSPITAL_COMMUNITY): Payer: Self-pay | Admitting: Obstetrics and Gynecology

## 2020-07-25 DIAGNOSIS — Z8759 Personal history of other complications of pregnancy, childbirth and the puerperium: Secondary | ICD-10-CM

## 2020-07-25 LAB — GLUCOSE, CAPILLARY: Glucose-Capillary: 97 mg/dL (ref 70–99)

## 2020-07-25 MED ORDER — DIPHENHYDRAMINE HCL 25 MG PO CAPS: 25.0000 mg | ORAL_CAPSULE | Freq: Four times a day (QID) | ORAL | Status: DC | PRN

## 2020-07-25 MED ORDER — SENNOSIDES-DOCUSATE SODIUM 8.6-50 MG PO TABS
2.0000 | ORAL_TABLET | ORAL | Status: DC
  Filled 2020-07-25: qty 2

## 2020-07-25 MED ORDER — DIBUCAINE (PERIANAL) 1 % EX OINT: 1.0000 "application " | TOPICAL_OINTMENT | CUTANEOUS | Status: DC | PRN

## 2020-07-25 MED ORDER — ONDANSETRON HCL 4 MG PO TABS: 4.0000 mg | ORAL_TABLET | ORAL | Status: DC | PRN

## 2020-07-25 MED ORDER — BENZOCAINE-MENTHOL 20-0.5 % EX AERO: 1.0000 "application " | INHALATION_SPRAY | CUTANEOUS | Status: DC | PRN

## 2020-07-25 MED ORDER — IBUPROFEN 600 MG PO TABS
600.0000 mg | ORAL_TABLET | Freq: Four times a day (QID) | ORAL | Status: DC
  Administered 2020-07-25 – 2020-07-26 (×5): 600 mg via ORAL
  Filled 2020-07-25 (×5): qty 1

## 2020-07-25 MED ORDER — SIMETHICONE 80 MG PO CHEW: 80.0000 mg | CHEWABLE_TABLET | ORAL | Status: DC | PRN

## 2020-07-25 MED ORDER — PRENATAL MULTIVITAMIN CH
1.0000 | ORAL_TABLET | Freq: Every day | ORAL | Status: DC
  Administered 2020-07-25 – 2020-07-26 (×2): 1 via ORAL
  Filled 2020-07-25 (×2): qty 1

## 2020-07-25 MED ORDER — ONDANSETRON HCL 4 MG/2ML IJ SOLN: 4.0000 mg | INTRAMUSCULAR | Status: DC | PRN

## 2020-07-25 MED ORDER — ACETAMINOPHEN 325 MG PO TABS
650.0000 mg | ORAL_TABLET | ORAL | Status: DC | PRN
  Administered 2020-07-25 – 2020-07-26 (×3): 650 mg via ORAL
  Filled 2020-07-25 (×3): qty 2

## 2020-07-25 MED ORDER — WITCH HAZEL-GLYCERIN EX PADS: 1.0000 "application " | MEDICATED_PAD | CUTANEOUS | Status: DC | PRN

## 2020-07-25 MED ORDER — COCONUT OIL OIL
1.0000 "application " | TOPICAL_OIL | Status: DC | PRN
  Administered 2020-07-26: 1 via TOPICAL

## 2020-07-25 MED ORDER — TETANUS-DIPHTH-ACELL PERTUSSIS 5-2.5-18.5 LF-MCG/0.5 IM SUSY: 0.5000 mL | PREFILLED_SYRINGE | Freq: Once | INTRAMUSCULAR | Status: DC

## 2020-07-25 NOTE — Anesthesia Postprocedure Evaluation (Signed)
Anesthesia Post Note  Patient: Donyae Kohn  Procedure(s) Performed: AN AD HOC LABOR EPIDURAL     Patient location during evaluation: Mother Baby Anesthesia Type: Epidural Level of consciousness: awake and alert and oriented Pain management: satisfactory to patient Vital Signs Assessment: post-procedure vital signs reviewed and stable Respiratory status: respiratory function stable Cardiovascular status: stable Postop Assessment: no headache, no backache, epidural receding, patient able to bend at knees, no signs of nausea or vomiting, adequate PO intake and able to ambulate Anesthetic complications: no   No complications documented.  Last Vitals:  Vitals:   07/25/20 0300 07/25/20 0430  BP: (!) 134/99 123/85  Pulse: (!) 109   Resp: 16   Temp: 37 C   SpO2:      Last Pain:  Vitals:   07/25/20 0513  TempSrc:   PainSc: 0-No pain   Pain Goal: Patients Stated Pain Goal: 10 (07/24/20 1200)                 Nigil Braman

## 2020-07-25 NOTE — Discharge Instructions (Signed)

## 2020-07-25 NOTE — Lactation Note (Signed)
This note was copied from a baby's chart. Lactation Consultation Note  Patient Name: Kristine Bryant VOZDG'U Date: 07/25/2020 Reason for consult: Initial assessment;1st time breastfeeding;Primapara;Maternal endocrine disorder;Early term 37-38.6wks Type of Endocrine Disorder?: Diabetes (GDM (uncontrolled))  Visited with mom of 31 hours old ETI female, she's a P1 and reported (+) breast changes during the pregnancy. She participated in the [facility] program during the pregnancy at the [facility] but she's not familiar with hand expression. LC showed mom how to hand express, she was able to get a small drop of colostrum out of her right breast, praised her for her efforts.  She doesn't have a DEBP at home, [facility] will be faxing a referral form since this baby is an ETI born right at 6 lbs and is having difficulties latching on. Mother's feeding choice on admission was to do combo feeding but parents have been feeding baby excessive amounts of formula, first feeding was 30 ml of Similac 20 calorie formula at only 4 hours of age.   Educated parents on formula supplementation guidelines, size of baby's stomach, LPI policy, feeding cues and normal newborn behavior. Mom told LC baby wasn't due for a feeding because she just fed formula again, this time "only" 15 ml but she was still spitting up formula, LC had to ask dad to keep burping baby in a vertical position because she started gagging on her bassinet when she kept burping.   LC asked mom to call for latch assistance the next time baby is ready to feed, she told LC baby has not latched at all since birth. She has been feeding just formula. Baby's serum glucose were WNL at 55 and 74 even though mom had uncontrolled GDM. LC provided lots of education to parents, regarding BF, newborn care and STS care. They also asked LC for assistance to fill out some formularies from the hospital, parents are Spanish speakers, formularies were in [place].   Mom willing to work on  BF and agreed to start pumping. She understands that pumping early on is mainly for breast stimulation and not to get volume. LC set up a DEBP, instructions, cleaning and storage were reviewed as well as milk storage guidelines. Mom pumping when exiting the room.  Feeding plan:  1. Encouraged mom to put baby to breast STS 8-12 times/24 hours or sooner if feeding cues are present 2. Hand expression and finger feeding were also encouraged 3. Mom will pump every 3 hours after feedings/attempts at the breast and will offer any drops of EBM she may get 4. Parents will continue supplementing baby per feeding choice/LPI policy, according to baby's age in hours  BF brochure (SP), BF resources (SP), feeding diary (SP) and LPI policy (SP) were reviewed. Dad present and supportive. Parents reported all questions and concerns were answered, they're both aware of LC OP services and will call PRN.   Maternal Data Formula Feeding for Exclusion: Yes Reason for exclusion: Mother's choice to formula and breast feed on admission Has patient been taught Hand Expression?: Yes Does the patient have breastfeeding experience prior to this delivery?: No  Feeding    LATCH Score                   Interventions Interventions: Breast feeding basics reviewed;Breast massage;Hand express;Breast compression;DEBP  Lactation Tools Discussed/Used Tools: Pump Breast pump type: Double-Electric Breast Pump WIC Program: Yes Pump Review: Setup, frequency, and cleaning;Milk Storage Initiated by:: MPeck Date initiated:: 07/25/20   Consult Status Consult Status: Follow-up  Date: 07/26/20 Follow-up type: In-patient    Kristine Bryant Kristine Bryant 07/25/2020, 10:58 AM

## 2020-07-25 NOTE — Discharge Summary (Addendum)
Postpartum Discharge Summary     Patient Name: Kristine Bryant DOB: 1986/02/27 MRN: [id number]  Date of admission: 07/24/2020 Delivery date:07/24/2020  Delivering provider: Randa Ngo  Date of discharge: 07/26/2020  Admitting diagnosis: Preeclampsia [O14.90] Intrauterine pregnancy: [redacted]w[redacted]d    Secondary diagnosis:  Principal Problem:   Vaginal delivery Active Problems:   Supervision of other normal pregnancy, antepartum   Group B streptococcus urinary tract infection affecting pregnancy   Gestational diabetes   Language barrier   Preeclampsia  Additional problems: as noted above  Discharge diagnosis: Vaginal delivery                                          Post partum procedures: none Augmentation: Pitocin and OP Foley Complications: None  Hospital course: Induction of Labor With Vaginal Delivery   34y.o. yo G1P0000 at 335w2das admitted to the hospital 07/24/2020 for induction of labor.  Indication for induction:  preeclampsia without severe features .  Patient had an uncomplicated labor course as follows: Membrane Rupture Time/Date: 3:38 PM ,07/24/2020   Delivery Method:Vaginal, Spontaneous  Episiotomy: None  Lacerations:  None  Details of delivery can be found in separate delivery note.  Patient had a routine postpartum course. Patient is discharged home 07/26/20.  Newborn Data: Birth date:07/24/2020  Birth time:11:55 PM  Gender:Female  Living status:Living  Apgars:9 ,9  Weight:2722 g   Magnesium Sulfate received: No BMZ received: No Rhophylac:N/A MMR:N/A T-DaP: Pt requests to have at HD Flu: No for reasons above Transfusion:No  Physical exam  Vitals:   07/25/20 1540 07/25/20 1946 07/26/20 0239 07/26/20 0611  BP: 126/89 128/89 (!) 128/92 124/84  Pulse: 99 (!) 104 (!) 102 96  Resp: _0 Temp: 98 F (36.7 C) 98.4 F (36.9 C)  97.9 F (36.6 C)  TempSrc: Oral Axillary  Axillary  SpO2: 98% 99%  98%  Weight:      Height:        General: alert, cooperative and no distress Lochia: appropriate Uterine Fundus: firm Incision: N/A DVT Evaluation: No evidence of DVT seen on physical exam. Labs: Lab Results  Component Value Date   WBC 13.9 (H) 07/24/2020   HGB 13.1 07/24/2020   HCT 38.4 07/24/2020   MCV 90.4 07/24/2020   PLT 190 07/24/2020   CMP Latest Ref Rng & Units 07/24/2020  Glucose 70 - 99 mg/dL 136(H)  BUN 6 - 20 mg/dL <5(L)  Creatinine 0.44 - 1.00 mg/dL 0.61  Sodium 135 - 145 mmol/L 136  Potassium 3.5 - 5.1 mmol/L 3.5  Chloride 98 - 111 mmol/L 105  CO2 22 - 32 mmol/L 19(L)  Calcium 8.9 - 10.3 mg/dL 9.1  Total Protein 6.5 - 8.1 g/dL 6.1(L)  Total Bilirubin 0.3 - 1.2 mg/dL 0.3  Alkaline Phos 38 - 126 U/L 116  AST 15 - 41 U/L 21  ALT 0 - 44 U/L 10   Edinburgh Score: Edinburgh Postnatal Depression Scale Screening Tool 07/25/2020  I have been able to laugh and see the funny side of things. 0  I have looked forward with enjoyment to things. 0  I have blamed myself unnecessarily when things went wrong. 0  I have been anxious or worried for no good reason. 0  I have felt scared or panicky for no good reason. 0  Things have been getting on top of me. 0  I have been so unhappy that I have had difficulty sleeping. 0  I have felt sad or miserable. 0  I have been so unhappy that I have been crying. 0  The thought of harming myself has occurred to me. 0  Edinburgh Postnatal Depression Scale Total 0     After visit meds:  Allergies as of 07/26/2020   No Known Allergies     Medication List    STOP taking these medications   calcium carbonate 500 MG chewable tablet Commonly known as: TUMS - dosed in mg elemental calcium   Doxylamine-Pyridoxine 10-10 MG Tbec   famotidine 20 MG tablet Commonly known as: Pepcid   metoCLOPramide 10 MG tablet Commonly known as: REGLAN     TAKE these medications   acetaminophen 325 MG tablet Commonly known as: Tylenol Take 2 tablets (650 mg total) by mouth  every 4 (four) hours as needed (for pain scale < 4).   amLODipine 5 MG tablet Commonly known as: NORVASC Take 1 tablet (5 mg total) by mouth daily.   ibuprofen 600 MG tablet Commonly known as: ADVIL Take 1 tablet (600 mg total) by mouth every 6 (six) hours.   norethindrone 0.35 MG tablet Commonly known as: Ortho Micronor Take 1 tablet (0.35 mg total) by mouth daily.   prenatal multivitamin Tabs tablet Take 1 tablet by mouth daily at 12 noon.        Discharge home in stable condition Infant Feeding:  breast & bottle Infant Disposition:home with mother Discharge instruction: per After Visit Summary and Postpartum booklet. Activity: Advance as tolerated. Pelvic rest for 6 weeks.  Diet: routine diet Future Appointments: Future Appointments  Date Time Provider [place]  08/05/2020  2:30 PM [facility] NURSE [facility]  09/05/2020  8:20 AM [facility]  09/05/2020 10:55 AM Clarnce Flock, MD [facility]   Follow up Visit: Message sent to [facility] clinic on 07/25/20  Please schedule this patient for a In person postpartum visit in 4 weeks with the following provider: Any provider. Additional Postpartum F/U:2 hour GTT 6-8 weeks and BP check 1 week  High risk pregnancy complicated by:  Preeclampsia without severe features, A1GDM Delivery mode:  Vaginal, Spontaneous  Anticipated Birth Control:  POPs   Rollins Wrightson, Gildardo Cranker, MD OB Fellow, Faculty Practice 07/26/2020 8:27 AM

## 2020-07-25 NOTE — Progress Notes (Addendum)
POSTPARTUM PROGRESS NOTE  Post Partum Day 1  Subjective:  Kristine Bryant is a 34 y.o. G1P1001 s/p VD at [redacted]w[redacted]d.  She reports she is doing well. No acute events overnight. She denies any problems with ambulating, voiding or po intake. Denies nausea or vomiting.  Pain is well controlled.  Lochia is appropriate.  Objective: Blood pressure 123/85, pulse (!) 109, temperature 98.6 F (37 C), temperature source Oral, resp. rate 16, height 5\' 3"  (1.6 m), weight 95.1 kg, last menstrual period 10/27/2019, SpO2 99 %, unknown if currently breastfeeding.  Physical Exam:  General: alert, cooperative and no distress Chest: no respiratory distress Heart: distal pulses intact Abdomen: soft, non-tender Uterine Fundus: firm, appropriately tender DVT Evaluation: No calf swelling or tenderness Extremities: No LE edema Skin: warm, dry  Recent Labs    07/24/20 0748 07/24/20 1751  HGB 12.5 13.1  HCT 38.2 38.4    Assessment/Plan: Kristine Bryant is a 34 y.o. G1P1001 s/p VD at [redacted]w[redacted]d.  PPD# 1 - Doing well -Routine postpartum care  Hx of pre-E w/o SF BP wnl. No concerning signs/symptoms -Monitor BPs  A1GDM -AM CBG 97  Contraception: undecided; no insurance Feeding: Both Dispo: Plan for discharge home tomorrow.   LOS: 1 day   [redacted]w[redacted]d, DO 07/25/2020, 5:21 AM PGY-2, [facility]  *Spanish interpreter present for entire encounter  Attestation of Supervision of Resident:  I confirm that I have verified the information documented in the  resident's  note and that I have also personally reperformed the history, physical exam and all medical decision making activities.  I have verified that all services and findings are accurately documented in this student's note; and I agree with management and plan as outlined in the documentation. I have also made any necessary editorial changes.  07/27/2020, MD Center for [facility], [facility] 07/25/2020 7:02 AM

## 2020-07-26 ENCOUNTER — Other Ambulatory Visit (HOSPITAL_COMMUNITY): Payer: Self-pay | Admitting: Obstetrics and Gynecology

## 2020-07-26 DIAGNOSIS — O24439 Gestational diabetes mellitus in the puerperium, unspecified control: Secondary | ICD-10-CM

## 2020-07-26 DIAGNOSIS — Z603 Acculturation difficulty: Secondary | ICD-10-CM

## 2020-07-26 MED ORDER — IBUPROFEN 600 MG PO TABS
600.0000 mg | ORAL_TABLET | Freq: Three times a day (TID) | ORAL | 0 refills | Status: DC | PRN
Start: 2020-07-26 — End: 2020-09-05

## 2020-07-26 MED ORDER — ACETAMINOPHEN 325 MG PO TABS
650.0000 mg | ORAL_TABLET | ORAL | Status: DC | PRN
Start: 2020-07-26 — End: 2020-09-05

## 2020-07-26 MED ORDER — NORETHINDRONE 0.35 MG PO TABS: 1.0000 | ORAL_TABLET | Freq: Every day | ORAL | 11 refills | Status: DC

## 2020-07-26 MED ORDER — AMLODIPINE BESYLATE 5 MG PO TABS: 5.0000 mg | ORAL_TABLET | Freq: Every day | ORAL | 1 refills | Status: DC

## 2020-07-26 MED ORDER — AMLODIPINE BESYLATE 5 MG PO TABS
5.0000 mg | ORAL_TABLET | Freq: Once | ORAL | Status: AC
  Administered 2020-07-26: 5 mg via ORAL
  Filled 2020-07-26: qty 1

## 2020-07-26 MED ORDER — TETANUS-DIPHTH-ACELL PERTUSSIS 5-2.5-18.5 LF-MCG/0.5 IM SUSY
0.5000 mL | PREFILLED_SYRINGE | Freq: Once | INTRAMUSCULAR | Status: AC
  Administered 2020-07-26: 0.5 mL via INTRAMUSCULAR
  Filled 2020-07-26: qty 0.5

## 2020-07-26 MED ORDER — IBUPROFEN 600 MG PO TABS
600.0000 mg | ORAL_TABLET | Freq: Four times a day (QID) | ORAL | 0 refills | Status: DC
Start: 2020-07-26 — End: 2020-07-26

## 2020-07-26 MED ORDER — ACETAMINOPHEN 325 MG PO TABS
650.0000 mg | ORAL_TABLET | ORAL | Status: DC | PRN
Start: 2020-07-26 — End: 2020-07-26

## 2020-07-26 MED FILL — NORETHINDRONE 0.35 MG TAB: 0.35 | 28 days supply | Qty: 28 | Fill #0

## 2020-07-26 MED FILL — AMLODIPINE BESYLATE 5 MG TA: 5 | 45 days supply | Qty: 45 | Fill #0

## 2020-07-26 MED FILL — IBUPROFEN 600 MG TABLET: 600 | 10 days supply | Qty: 30 | Fill #0

## 2020-07-26 NOTE — Lactation Note (Signed)
This note was copied from a baby's chart. Lactation Consultation Note  Patient Name: Girl Yeily Link KGMWN'U Date: 07/26/2020 Reason for consult: Follow-up assessment   [employer] video interpretor on line Arianna [id number], on line for all teaching and interpretor .  Mother reports that infant is feeding well. She has [facility] to [facility].   Mother plans to get formula from [facility] . She will use her hand pump .   Mother has been breastfeeding and bottle feeding infant . Infant is 37+1 weeks and is 6 lbs and 6 % wt loss. .   Discussed treatment and prevention of engorgement.   Plan of Care :  Breastfeed infant with feeding cues Supplement infant with ebm/formula, according to supplemental guidelines. Pump using a hand pump after each feeding for 15-20 mins.   Mother to continue to cue base feed infant and feed at least 8-12 times or more in 24 hours and advised to allow for cluster feeding infant as needed.   Mother to continue to due STS. Mother is aware of available LC services at [facility], BFSG'S, OP Dept, and phone # for questions or concerns about breastfeeding.  Mother receptive to all teaching and plan of care.     Maternal Data    Feeding Nipple Type: Slow - flow  LATCH Score                   Interventions    Lactation Tools Discussed/Used     Consult Status      Michel Bickers 07/26/2020, 1:09 PM

## 2020-08-05 ENCOUNTER — Ambulatory Visit (INDEPENDENT_AMBULATORY_CARE_PROVIDER_SITE_OTHER): Payer: Self-pay | Admitting: *Deleted

## 2020-08-05 ENCOUNTER — Other Ambulatory Visit: Payer: Self-pay

## 2020-08-05 VITALS — BP 117/74 | HR 105 | Ht 63.0 in | Wt 188.2 lb

## 2020-08-05 DIAGNOSIS — Z0130 Encounter for examination of blood pressure without abnormal findings: Secondary | ICD-10-CM

## 2020-08-05 NOTE — Progress Notes (Signed)
Interpreter from [employer] was present for encounter. Pt presents for BP check following pre-eclampsia and vaginal delivery on 07/25/20. Pt states she is feeling great! She denies H/A and visual disturbances. BP - 117/74, P - 105. Pt reports she is taking Amlodipine as prescribed. Post partum appt with 2hr GTT is scheduled on 12/30. Pt was advised to continue taking Amlodipine and keep next appt as scheduled. She voiced understanding and had no questions.

## 2020-08-06 NOTE — Progress Notes (Signed)
Agree with A & P. 

## 2020-09-05 ENCOUNTER — Other Ambulatory Visit: Payer: Self-pay

## 2020-09-05 ENCOUNTER — Telehealth (INDEPENDENT_AMBULATORY_CARE_PROVIDER_SITE_OTHER): Payer: Self-pay | Admitting: Family Medicine

## 2020-09-05 ENCOUNTER — Encounter: Payer: Self-pay | Admitting: Family Medicine

## 2020-09-05 DIAGNOSIS — Z30011 Encounter for initial prescription of contraceptive pills: Secondary | ICD-10-CM

## 2020-09-05 DIAGNOSIS — O24439 Gestational diabetes mellitus in the puerperium, unspecified control: Secondary | ICD-10-CM

## 2020-09-05 DIAGNOSIS — O24410 Gestational diabetes mellitus in pregnancy, diet controlled: Secondary | ICD-10-CM

## 2020-09-05 DIAGNOSIS — Z789 Other specified health status: Secondary | ICD-10-CM

## 2020-09-05 DIAGNOSIS — Z8759 Personal history of other complications of pregnancy, childbirth and the puerperium: Secondary | ICD-10-CM

## 2020-09-05 NOTE — Progress Notes (Signed)
I connected with@ on 09/05/20 at 10:55 AM EST by: Mychart video and verified that I am speaking with the correct person using two identifiers.  Patient is located at home and provider is located at [employer] for [employer] at [employer] [facility] .     The purpose of this virtual visit is to provide medical care while limiting exposure to the novel coronavirus. I discussed the limitations, risks, security and privacy concerns of performing an evaluation and management service by MyChart and the availability of in person appointments. I also discussed with the patient that there may be a patient responsible charge related to this service. By engaging in this virtual visit, you consent to the provision of healthcare.  Additionally, you authorize for your insurance to be billed for the services provided during this visit.  The patient expressed understanding and agreed to proceed.  The following staff members participated in the virtual visit:  Venora Maples, MD/MPH  Post Partum Visit Note Subjective:   Kristine Bryant is a 34 y.o. G24P1001 female being evaluated for postpartum followup.  She is 6 weeks postpartum following a normal spontaneous vaginal delivery at  37.2 gestational weeks.  I have fully reviewed the prenatal and intrapartum course; pregnancy complicated by preeclampsia.  Postpartum course has been unremarkable. Baby is doing well. Baby is feeding by both breast and bottle - Gerber. Bleeding no bleeding. Bowel function is normal. Bladder function is normal. Patient is sexually active. Contraception method is oral progesterone-only contraceptive. Postpartum depression screening: negative.   The pregnancy intention screening data noted above was reviewed. Potential methods of contraception were discussed. The patient elected to proceed with Oral Contraceptive.   The following portions of the patient's history were reviewed and updated as appropriate: allergies, current  medications, past family history, past medical history, past social history, past surgical history and problem list.  Review of Systems Pertinent items noted in HPI and remainder of comprehensive ROS otherwise negative.   Objective:  There were no vitals filed for this visit. Self-Obtained       Assessment:    Normal virtual postpartum exam.  Plan:  Essential components of care per ACOG recommendations:  1.  Mood and well being: Patient with negative depression screening today. Reviewed local resources for support.  - Patient does not use tobacco. - hx of drug use? No   2. Infant care and feeding:  -Patient currently breastmilk feeding? Yes Discussed return to work and pumping. If needed, patient was provided letter for work to allow for every 2-3 hr pumping breaks, and to be granted a private location to express breastmilk and refrigerated area to store breastmilk. Reviewed importance of draining breast regularly to support lactation. -Social determinants of health (SDOH) reviewed in EPIC. No concerns  3. Sexuality, contraception and birth spacing - Patient does want a pregnancy in the next year.  Desired family size is 2 children.  - Reviewed forms of contraception in tiered fashion. Patient desired oral progesterone-only contraceptive today.  Repeatedly reinforced importance of taking on time. - Discussed birth spacing of 18 months  4. Sleep and fatigue -Encouraged family/partner/community support of 4 hrs of uninterrupted sleep to help with mood and fatigue  5. Physical Recovery  - Discussed patients delivery and complications  - Patient has urinary incontinence? No - Patient is safe to resume physical and sexual activity  6.  Health Maintenance - Last pap smear done 02/07/2020 and was normal with negative HPV.  7. Chronic  Disease - Still taking Amlodipine 5 mg, BP was normal at 1wk check, will schedule for follow up check in 2 weeks (unable to have in person visit due to  recent COVID diagnosis) hopefully can d/c at that time - Needs 2hr GTT at follow up visit as well  8. COVID - No chest pain or SOB or other red flag symptoms  10 minutes of non-face-to-face time spent with the patient    Center for [employer], [facility]

## 2020-09-05 NOTE — Progress Notes (Signed)
Called pt with [name] at 1055; VM left stating I am

## 2020-09-08 DIAGNOSIS — O1493 Unspecified pre-eclampsia, third trimester: Secondary | ICD-10-CM

## 2020-09-09 MED ORDER — AMLODIPINE BESYLATE 5 MG PO TABS: 5.0000 mg | ORAL_TABLET | Freq: Every day | ORAL | 0 refills | Status: DC

## 2020-09-10 ENCOUNTER — Encounter: Payer: Self-pay | Admitting: Family Medicine

## 2020-09-16 ENCOUNTER — Other Ambulatory Visit: Payer: Self-pay

## 2020-09-16 ENCOUNTER — Ambulatory Visit (INDEPENDENT_AMBULATORY_CARE_PROVIDER_SITE_OTHER): Payer: Self-pay

## 2020-09-16 VITALS — BP 114/74 | HR 82 | Wt 177.5 lb

## 2020-09-16 DIAGNOSIS — Z0130 Encounter for examination of blood pressure without abnormal findings: Secondary | ICD-10-CM

## 2020-09-16 DIAGNOSIS — O24410 Gestational diabetes mellitus in pregnancy, diet controlled: Secondary | ICD-10-CM

## 2020-09-16 NOTE — Progress Notes (Signed)
Here for BP check following vaginal delivery on 07/24/20. Pt had diagnosis of pre-eclampsia and was prescribed Norvasc 5 mg daily at discharge. Pt reports compliance with medication schedule. No prior history of hypertension. Denies s/s of hypertension. BP today is 114/74, HR 82. Reviewed with Debroah Loop, MD who states pt may discontinue Norvasc. Pt encouraged to establish primary care provider for future needs and to follow up with our office for annual visit in 1 year. [employer] interpreter Edelin ID [id number] interpreted this encounter.  Fleet Contras RN 09/16/20

## 2020-09-17 LAB — GLUCOSE TOLERANCE, 2 HOURS
Glucose, 2 hour: 107 mg/dL (ref 65–139)
Glucose, GTT - Fasting: 80 mg/dL (ref 65–99)

## 2020-09-19 ENCOUNTER — Ambulatory Visit: Payer: Self-pay

## 2020-10-17 ENCOUNTER — Other Ambulatory Visit: Payer: Self-pay | Admitting: Nurse Practitioner

## 2020-10-17 DIAGNOSIS — O1493 Unspecified pre-eclampsia, third trimester: Secondary | ICD-10-CM

## 2020-12-09 ENCOUNTER — Encounter: Payer: Self-pay | Admitting: *Deleted

## 2021-12-25 ENCOUNTER — Inpatient Hospital Stay (HOSPITAL_COMMUNITY)
Admission: AD | Admit: 2021-12-25 | Discharge: 2021-12-25 | Disposition: A | Discharge status: Home / Self Care | Payer: Medicaid Other | Attending: Obstetrics & Gynecology | Admitting: Obstetrics & Gynecology

## 2021-12-25 ENCOUNTER — Encounter (HOSPITAL_COMMUNITY): Payer: Self-pay | Admitting: *Deleted

## 2021-12-25 ENCOUNTER — Inpatient Hospital Stay (HOSPITAL_COMMUNITY): Payer: Medicaid Other

## 2021-12-25 DIAGNOSIS — Z3A01 Less than 8 weeks gestation of pregnancy: Secondary | ICD-10-CM | POA: Insufficient documentation

## 2021-12-25 DIAGNOSIS — O209 Hemorrhage in early pregnancy, unspecified: Secondary | ICD-10-CM | POA: Diagnosis not present

## 2021-12-25 DIAGNOSIS — O3680X0 Pregnancy with inconclusive fetal viability, not applicable or unspecified: Secondary | ICD-10-CM | POA: Insufficient documentation

## 2021-12-25 DIAGNOSIS — Z3491 Encounter for supervision of normal pregnancy, unspecified, first trimester: Secondary | ICD-10-CM

## 2021-12-25 LAB — URINALYSIS, ROUTINE W REFLEX MICROSCOPIC
Bilirubin Urine: NEGATIVE
Glucose, UA: NEGATIVE mg/dL
Hgb urine dipstick: NEGATIVE
Ketones, ur: NEGATIVE mg/dL
Leukocytes,Ua: NEGATIVE
Nitrite: NEGATIVE
Protein, ur: NEGATIVE mg/dL
Specific Gravity, Urine: 1.01 (ref 1.005–1.030)
pH: 6 (ref 5.0–8.0)

## 2021-12-25 LAB — CBC
HCT: 37.5 % (ref 36.0–46.0)
Hemoglobin: 12.8 g/dL (ref 12.0–15.0)
MCH: 30.6 pg (ref 26.0–34.0)
MCHC: 34.1 g/dL (ref 30.0–36.0)
MCV: 89.7 fL (ref 80.0–100.0)
Platelets: 259 10*3/uL (ref 150–400)
RBC: 4.18 MIL/uL (ref 3.87–5.11)
RDW: 13.1 % (ref 11.5–15.5)
WBC: 8.6 10*3/uL (ref 4.0–10.5)
nRBC: 0 % (ref 0.0–0.2)

## 2021-12-25 LAB — HCG, QUANTITATIVE, PREGNANCY: hCG, Beta Chain, Quant, S: 2901 m[IU]/mL — ABNORMAL HIGH (ref <5)

## 2021-12-25 LAB — WET PREP, GENITAL
Clue Cells Wet Prep HPF POC: NONE SEEN
Sperm: NONE SEEN
Trich, Wet Prep: NONE SEEN
WBC, Wet Prep HPF POC: <10 (ref <10)
Yeast Wet Prep HPF POC: NONE SEEN

## 2021-12-25 LAB — POCT PREGNANCY, URINE: Preg Test, Ur: POSITIVE — AB

## 2021-12-25 NOTE — MAU Note (Addendum)
Pt reports having vaginal bleeding and abdominal cramping since 2000. LMP 10/30/21. Had u/s yesterday at [facility] and everything was ok. This morning I slipped and caught myself using the wall but did not hit my stomach. Pt instructed in obtaining vaginal swabs which she did without difficulty. ?

## 2021-12-25 NOTE — MAU Note (Signed)
Len Blalock CNM in Family Room with Sunday Spillers, in-house Spanish interpreter, to discuss test results and d/c plan. ? ?

## 2021-12-25 NOTE — Progress Notes (Signed)
Written and verbal d/c instructions given and understanding voiced. Kristine Bryant, spanish interpreter used for d/c ?

## 2021-12-25 NOTE — MAU Provider Note (Signed)
?History  ?  ? ?CSN: [id number] ? ?Arrival date and time: 12/25/21 2110 ? ? ?Chief Complaint  ?Patient presents with  ? Abdominal Pain  ? ?HPI ?Kristine Bryant is a 36 y.o. G2P1001 at [redacted]w[redacted]d who presents with abdominal pain and bleeding. She states [facility] and had an ultrasound where she was told everything was fine. Today she started having lower abdominal cramping. She rates the pain a 6/10 and has not tried anything for the pain. She also reports spotting when she wipes. ? ?OB History   ? ? Gravida  ?2  ? Para  ?1  ? Term  ?1  ? Preterm  ?0  ? AB  ?0  ? Living  ?1  ?  ? ? SAB  ?   ? IAB  ?   ? Ectopic  ?   ? Multiple  ?0  ? Live Births  ?1  ?   ?  ?  ? ? ?Past Medical History:  ?Diagnosis Date  ? Diabetes mellitus without complication ([place])   ? GDM  ? Pregnancy induced hypertension   ? Supervision of other normal pregnancy, antepartum 02/07/2020  ?  Nursing Staff Provider Office Location [facility] Dating  Early ultrasound  Language  Spanish  Anatomy US  Incomplete spine eval (repeat needed, see note) Anterior placenta, 9.7% by LMP (which is likely appropriate by [place] dating) Female  24th%tile at MFM Flu Vaccine  Pt requests to have at HD Genetic Screen  NIPS:   AFP:   First Screen:  Quad:  Declined   TDaP vaccine  ^see above Hgb A1C or  GTT Early  T  ? ? ?Past Surgical History:  ?Procedure Laterality Date  ? APPENDECTOMY    ? ? ?No family history on file. ? ?Social History  ? ?Tobacco Use  ? Smoking status: Never  ? Smokeless tobacco: Never  ?Vaping Use  ? Vaping Use: Never used  ?Substance Use Topics  ? Drug use: Never  ? ? ?Allergies: No Known Allergies ? ?No medications prior to admission.  ? ? ?Review of Systems  ?Constitutional: Negative.  Negative for fatigue and fever.  ?HENT: Negative.    ?Respiratory: Negative.  Negative for shortness of breath.   ?Cardiovascular: Negative.  Negative for chest pain.  ?Gastrointestinal:  Positive for abdominal pain. Negative for  constipation, diarrhea, nausea and vomiting.  ?Genitourinary:  Positive for vaginal bleeding. Negative for dysuria.  ?Neurological: Negative.  Negative for dizziness and headaches.  ?Physical Exam  ? ?Blood pressure 110/71, pulse 70, temperature 98.5 ?F (36.9 ?C), resp. rate 17, height 5\' 1"  (1.549 m), weight 78.9 kg, last menstrual period 10/30/2021, SpO2 100 %, currently breastfeeding. ? ?Physical Exam ?Vitals and nursing note reviewed.  ?Constitutional:   ?   General: She is not in acute distress. ?   Appearance: She is well-developed.  ?HENT:  ?   Head: Normocephalic.  ?Eyes:  ?   Pupils: Pupils are equal, round, and reactive to light.  ?Cardiovascular:  ?   Rate and Rhythm: Normal rate and regular rhythm.  ?   Heart sounds: Normal heart sounds.  ?Pulmonary:  ?   Effort: Pulmonary effort is normal. No respiratory distress.  ?   Breath sounds: Normal breath sounds.  ?Abdominal:  ?   General: Bowel sounds are normal. There is no distension.  ?   Palpations: Abdomen is soft.  ?   Tenderness: There is no abdominal tenderness.  ?Genitourinary: ?  Vagina: No bleeding.  ?Skin: ?   General: Skin is warm and dry.  ?Neurological:  ?   Mental Status: She is alert and oriented to person, place, and time.  ?Psychiatric:     ?   Mood and Affect: Mood normal.     ?   Behavior: Behavior normal.     ?   Thought Content: Thought content normal.     ?   Judgment: Judgment normal.  ? ? ?MAU Course  ?Procedures ?Results for orders placed or performed during the hospital encounter of 12/25/21 (from the past 24 hour(s))  ?Urinalysis, Routine w reflex microscopic Urine, Clean Catch     Status: Abnormal  ? Collection Time: 12/25/21  9:17 PM  ?Result Value Ref Range  ? Color, Urine STRAW (A) YELLOW  ? APPearance CLEAR CLEAR  ? Specific Gravity, Urine 1.010 1.005 - 1.030  ? pH 6.0 5.0 - 8.0  ? Glucose, UA NEGATIVE NEGATIVE mg/dL  ? Hgb urine dipstick NEGATIVE NEGATIVE  ? Bilirubin Urine NEGATIVE NEGATIVE  ? Ketones, ur NEGATIVE NEGATIVE  mg/dL  ? Protein, ur NEGATIVE NEGATIVE mg/dL  ? Nitrite NEGATIVE NEGATIVE  ? Leukocytes,Ua NEGATIVE NEGATIVE  ? RBC / HPF 0-5 0 - 5 RBC/hpf  ? WBC, UA 0-5 0 - 5 WBC/hpf  ? Bacteria, UA RARE (A) NONE SEEN  ? Squamous Epithelial / LPF 0-5 0 - 5  ?Wet prep, genital     Status: None  ? Collection Time: 12/25/21  9:28 PM  ? Specimen: Vaginal  ?Result Value Ref Range  ? Yeast Wet Prep HPF POC NONE SEEN NONE SEEN  ? Trich, Wet Prep NONE SEEN NONE SEEN  ? Clue Cells Wet Prep HPF POC NONE SEEN NONE SEEN  ? WBC, Wet Prep HPF POC <10 <10  ? Sperm NONE SEEN   ?CBC     Status: None  ? Collection Time: 12/25/21  9:37 PM  ?Result Value Ref Range  ? WBC 8.6 4.0 - 10.5 K/uL  ? RBC 4.18 3.87 - 5.11 MIL/uL  ? Hemoglobin 12.8 12.0 - 15.0 g/dL  ? HCT 37.5 36.0 - 46.0 %  ? MCV 89.7 80.0 - 100.0 fL  ? MCH 30.6 26.0 - 34.0 pg  ? MCHC 34.1 30.0 - 36.0 g/dL  ? RDW 13.1 11.5 - 15.5 %  ? Platelets 259 150 - 400 K/uL  ? nRBC 0.0 0.0 - 0.2 %  ?hCG, quantitative, pregnancy     Status: Abnormal  ? Collection Time: 12/25/21  9:37 PM  ?Result Value Ref Range  ? hCG, Beta Chain, Quant, S 2,901 (H) <5 mIU/mL  ?Pregnancy, urine POC     Status: Abnormal  ? Collection Time: 12/25/21  9:41 PM  ?Result Value Ref Range  ? Preg Test, Ur POSITIVE (A) NEGATIVE  ?  ?US OB LESS THAN 14 WEEKS WITH OB TRANSVAGINAL ? ?Result Date: 12/25/2021 ?CLINICAL DATA:  Cramping and vaginal bleeding. EXAM: OBSTETRIC <14 WK [place] AND TRANSVAGINAL OB US TECHNIQUE: Both transabdominal and transvaginal ultrasound examinations were performed for complete evaluation of the gestation as well as the maternal uterus, adnexal regions, and pelvic cul-de-sac. Transvaginal technique was performed to assess early pregnancy. COMPARISON:  None. FINDINGS: Intrauterine gestational sac: Single Yolk sac:  Visualized. Embryo:  Visualized. Cardiac Activity: Visualized. Heart Rate: 26 bpm CRL:  4.2 mm   6 w   1 d                  [place]: August 19, 2022 Subchorionic hemorrhage:  None visualized.  Maternal uterus/adnexae: The bilateral ovaries are visualized and are normal in appearance. No pelvic free fluid is seen. IMPRESSION: 1. Single, viable intrauterine pregnancy at approximately 6 weeks and 1 day gestation by ultrasound evaluation. 2. Fetal heart rate of 26 beats per minute. Correlation with continued follow-up pelvic ultrasound is recommended to confirm fetal viability. Electronically Signed   By: Virgina Norfolk M.D.   On: 12/25/2021 22:08    ? ?MDM ?UA, UPT ?CBC, HCG ?ABO/Rh-  ?Wet prep and gc/chlamydia ?US OB Comp Less 14 weeks with Transvaginal ? ?Discussed with patient and partner very low FHR noted on ultrasound. Possible reasons reviewed at length and cautioned patient that with pain, bleeding and low FHR, these could be early signs of impending miscarriage. Recommended follow up discussed and patient agreeable to plan of care ? ?Assessment and Plan  ? ?1. Normal intrauterine pregnancy on prenatal ultrasound in first trimester   ?2. Pregnancy with inconclusive fetal viability, single or unspecified fetus   ?3. [redacted] weeks gestation of pregnancy   ? ?-Discharge home in stable condition ?-Miscarriage precautions discussed ?-Patient advised to follow-up with [facility] in 10 days for repeat ultrasound, order placed ?-Patient may return to MAU as needed or if her condition were to change or worsen ? ? ?Wende Mott CNM ?12/25/2021, 10:36 PM  ?

## 2021-12-25 NOTE — Discharge Instructions (Signed)

## 2021-12-25 NOTE — Progress Notes (Signed)
PT is Spanish speaking ?

## 2021-12-26 LAB — GC/CHLAMYDIA PROBE AMP (~~LOC~~) NOT AT ARMC
Chlamydia: NEGATIVE
Comment: NEGATIVE
Comment: NORMAL
Neisseria Gonorrhea: NEGATIVE

## 2021-12-27 ENCOUNTER — Other Ambulatory Visit: Payer: Self-pay

## 2021-12-27 ENCOUNTER — Inpatient Hospital Stay (HOSPITAL_COMMUNITY)
Admission: AD | Admit: 2021-12-27 | Discharge: 2021-12-27 | Disposition: A | Discharge status: Home / Self Care | Payer: Medicaid Other | Attending: Obstetrics and Gynecology | Admitting: Obstetrics and Gynecology

## 2021-12-27 ENCOUNTER — Encounter (HOSPITAL_COMMUNITY): Payer: Self-pay | Admitting: Obstetrics & Gynecology

## 2021-12-27 ENCOUNTER — Inpatient Hospital Stay (HOSPITAL_COMMUNITY): Payer: Medicaid Other

## 2021-12-27 DIAGNOSIS — O039 Complete or unspecified spontaneous abortion without complication: Secondary | ICD-10-CM

## 2021-12-27 LAB — CBC
HCT: 37.2 % (ref 36.0–46.0)
Hemoglobin: 12.6 g/dL (ref 12.0–15.0)
MCH: 30.2 pg (ref 26.0–34.0)
MCHC: 33.9 g/dL (ref 30.0–36.0)
MCV: 89.2 fL (ref 80.0–100.0)
Platelets: 234 10*3/uL (ref 150–400)
RBC: 4.17 MIL/uL (ref 3.87–5.11)
RDW: 13 % (ref 11.5–15.5)
WBC: 6.6 10*3/uL (ref 4.0–10.5)
nRBC: 0 % (ref 0.0–0.2)

## 2021-12-27 LAB — COMPREHENSIVE METABOLIC PANEL
ALT: 14 U/L (ref 0–44)
AST: 16 U/L (ref 15–41)
Albumin: 4.1 g/dL (ref 3.5–5.0)
Alkaline Phosphatase: 63 U/L (ref 38–126)
Anion gap: 7 (ref 5–15)
BUN: 9 mg/dL (ref 6–20)
CO2: 26 mmol/L (ref 22–32)
Calcium: 8.8 mg/dL — ABNORMAL LOW (ref 8.9–10.3)
Chloride: 105 mmol/L (ref 98–111)
Creatinine, Ser: 0.73 mg/dL (ref 0.44–1.00)
GFR, Estimated: >60 mL/min (ref >60)
Glucose, Bld: 93 mg/dL (ref 70–99)
Potassium: 4.1 mmol/L (ref 3.5–5.1)
Sodium: 138 mmol/L (ref 135–145)
Total Bilirubin: 0.6 mg/dL (ref 0.3–1.2)
Total Protein: 6.9 g/dL (ref 6.5–8.1)

## 2021-12-27 LAB — URINALYSIS, ROUTINE W REFLEX MICROSCOPIC
Bilirubin Urine: NEGATIVE
Glucose, UA: NEGATIVE mg/dL
Ketones, ur: NEGATIVE mg/dL
Nitrite: NEGATIVE
Protein, ur: NEGATIVE mg/dL
RBC / HPF: >50 RBC/hpf — ABNORMAL HIGH (ref 0–5)
Specific Gravity, Urine: 1.012 (ref 1.005–1.030)
WBC, UA: >50 WBC/hpf — ABNORMAL HIGH (ref 0–5)
pH: 7 (ref 5.0–8.0)

## 2021-12-27 MED ORDER — IBUPROFEN 800 MG PO TABS: 800.0000 mg | ORAL_TABLET | Freq: Three times a day (TID) | ORAL | 0 refills | Status: DC

## 2021-12-27 MED ORDER — ACETAMINOPHEN-CODEINE #3 300-30 MG PO TABS: 1.0000 | ORAL_TABLET | Freq: Four times a day (QID) | ORAL | 0 refills | Status: DC | PRN

## 2021-12-27 MED ORDER — PROMETHAZINE HCL 12.5 MG PO TABS: 12.5000 mg | ORAL_TABLET | Freq: Four times a day (QID) | ORAL | 0 refills | Status: DC | PRN

## 2021-12-27 MED ORDER — MISOPROSTOL 200 MCG PO TABS
800.0000 ug | ORAL_TABLET | Freq: Once | ORAL | Status: AC
  Administered 2021-12-27: 800 ug via BUCCAL
  Filled 2021-12-27: qty 4

## 2021-12-27 MED ORDER — KETOROLAC TROMETHAMINE 60 MG/2ML IM SOLN
60.0000 mg | Freq: Once | INTRAMUSCULAR | Status: AC
  Administered 2021-12-27: 60 mg via INTRAMUSCULAR
  Filled 2021-12-27: qty 2

## 2021-12-27 NOTE — MAU Note (Signed)
Kristine Bryant is a 36 y.o. at [redacted]w[redacted]d here in MAU reporting: cramping in lower abdomen mostly on the right side.  Also reports having VB, VB began on Thursday.  States currently wearing sanitary napkin. Reports seen in MAU on Thursday and instructed to return if VB continued.   ? ?Onset of complaint: today ?Pain score: 9/10 ?Vitals:  ? 12/27/21 1050  ?BP: 100/63  ?Pulse: 72  ?Resp: 18  ?Temp: 98.2 ?F (36.8 ?C)  ?SpO2: 100%  ?   ?FHT:N/A ?Lab orders placed from triage:   UA ?

## 2021-12-27 NOTE — MAU Provider Note (Signed)
Patient Kristine Bryant is a 36 y.o. G2P1001 ? At [redacted]w[redacted]d here with complaints of vaginal bleeding that has gotten worse over the past two days. She denies SOB, fever, chest pain, NV, diarrhea/constipation.  ? ?She was diagnosed with pregnancy of uncertain liability on 4-20; she was given return precautions. ?History  ?  ? ?CSN: [id number] ? ?Arrival date and time: 12/27/21 1035 ? ? Event Date/Time  ? First Provider Initiated Contact with Patient 12/27/21 1116   ?  ? ?No chief complaint on file. ? ?Vaginal Bleeding ?The patient's primary symptoms include vaginal bleeding. The current episode started in the past 7 days. The problem occurs constantly. The problem has been gradually worsening. The problem affects the right side. She is pregnant. Associated symptoms include abdominal pain. Pertinent negatives include no constipation, diarrhea, dysuria, fever, urgency or vomiting. The vaginal discharge was bloody. The vaginal bleeding is heavier than menses. She has been passing clots. She has been passing tissue.  ?She tried Tylenol at home but no improvement.  ?OB History   ? ? Gravida  ?2  ? Para  ?1  ? Term  ?1  ? Preterm  ?0  ? AB  ?0  ? Living  ?1  ?  ? ? SAB  ?   ? IAB  ?   ? Ectopic  ?   ? Multiple  ?0  ? Live Births  ?1  ?   ?  ?  ? ? ?Past Medical History:  ?Diagnosis Date  ? Diabetes mellitus without complication (HCC)   ? GDM  ? Pregnancy induced hypertension   ? Supervision of other normal pregnancy, antepartum 02/07/2020  ?  Nursing Staff Provider Office Location [facility] Dating  Early ultrasound  Language  Spanish  Anatomy US  Incomplete spine eval (repeat needed, see note) Anterior placenta, 9.7% by LMP (which is likely appropriate by [place] dating) Female  24th%tile at MFM Flu Vaccine  Pt requests to have at HD Genetic Screen  NIPS:   AFP:   First Screen:  Quad:  Declined   TDaP vaccine  ^see above Hgb A1C or  GTT Early  T  ? ? ?Past Surgical History:  ?Procedure Laterality Date  ? APPENDECTOMY    ? ? ?History  reviewed. No pertinent family history. ? ?Social History  ? ?Tobacco Use  ? Smoking status: Never  ? Smokeless tobacco: Never  ?Vaping Use  ? Vaping Use: Never used  ?Substance Use Topics  ? Drug use: Never  ? ? ?Allergies: No Known Allergies ? ?Medications Prior to Admission  ?Medication Sig Dispense Refill Last Dose  ? acetaminophen (TYLENOL) 650 MG CR tablet Take 650 mg by mouth every 8 (eight) hours as needed for pain.   12/26/2021  ? Prenatal Vit-Fe Fumarate-FA (PRENATAL MULTIVITAMIN) TABS tablet Take 1 tablet by mouth daily at 12 noon.   12/26/2021  ? amLODipine (NORVASC) 5 MG tablet Take 1 tablet (5 mg total) by mouth daily. 45 tablet 0   ? ? ?Review of Systems  ?Constitutional:  Negative for fever.  ?Gastrointestinal:  Positive for abdominal pain. Negative for constipation, diarrhea and vomiting.  ?Genitourinary:  Positive for vaginal bleeding. Negative for dysuria and urgency.  ?Physical Exam  ? ?Blood pressure 100/63, pulse 72, temperature 98.2 ?F (36.8 ?C), temperature source Oral, resp. rate 18, height 5\' 1"  (1.549 m), weight 78.9 kg, last menstrual period 10/30/2021, SpO2 100 %, currently breastfeeding. ? ?Physical Exam ?Constitutional:   ?   Appearance: Normal appearance.  ?  Cardiovascular:  ?   Rate and Rhythm: Normal rate.  ?Abdominal:  ?   General: Abdomen is flat.  ?Genitourinary: ?   General: Normal vulva.  ?   Comments: NEFG; trace amount of dark red blood in the vagina, no active bleeding and no clots. She is tender on exam.  ?Musculoskeletal:     ?   General: Normal range of motion.  ?Skin: ?   General: Skin is warm.  ?   Capillary Refill: Capillary refill takes less than 2 seconds.  ?Neurological:  ?   General: No focal deficit present.  ?   Mental Status: She is alert.  ?Psychiatric:     ?   Mood and Affect: Mood normal.  ? ?MAU Course  ?Procedures ? ?MDM ?-US shows IUP without cardiac activity; this is a change from 2 days ago in which patient had emrbyo with FHR of 26.  ?-toradol injection  given; patient went from 9/10 to 2/10 on pain scale ? ?-patient CBC is 12.6; she is stable.  ? ?Long discussion with patient  ?   ?Early Intrauterine Pregnancy Failure Protocol ?X  Documented intrauterine pregnancy failure less than or equal to [redacted] weeks   gestation  ?X  No serious current illness  ?X  Baseline Hgb greater than or equal to 10g/dl  ?X  Patient has easily accessible transportation to the hospital  ?X  Clear preference  ?X  Practitioner/physician deems patient reliable  ?X  Counseling by practitioner or physician  ?X  Patient education by RN  ?_  Consent form signed  ?     Rho-Gam given by RN if indicated  ?X  Medication dispensed  ?X  Cytotec 800 mcg Intravaginally by patient at home  ?     Intravaginally by NP in MAU  ?     Rectally by patient at home  ?     Orally by RN in MAU  ?X   Ibuprofen 600 mg 1 tablet by mouth every 6 hours as needed #30 - prescribed  ?X   Tylenol #3 mg by mouth every 4 to 6 hours as needed - prescribed  ?X   Phenergan 12.5 mg by mouth every 4 hours as needed for nausea - prescribed ? ?Reviewed with pt cytotec procedure.  Pt verbalizes that she lives close to the hospital and has transportation readily available.  Pt appears reliable and verbalizes understanding and agrees with plan of care ? ? ?Reassessment (3:11 PM) ? ?Patient had 4 doses of 200 mg of buccal cytotec placed; she tolerated it well; she has had PO and crackers as well. No significant bleeding while in MAU.  ? ?Assessment and Plan  ? ?1. Miscarriage   ? ?-Patient stable for discharge; hemoglobin is reassuring and she is tolerating PO and crackers ?-strict return precautions given, she was instructed to take cytotec in 3 days if no significant bleeding ?-message sent to schedule patient for follow up in two weeks  ?-RX given for antiemetics, pain ?-all questions answered, patient and partner verbalized understanding of the plan of care ? ?Kristine Bryant ?12/27/2021, 11:25 AM  ?

## 2021-12-27 NOTE — Discharge Instructions (Signed)

## 2022-01-08 ENCOUNTER — Telehealth: Payer: Self-pay

## 2022-01-08 ENCOUNTER — Ambulatory Visit
Admission: RE | Admit: 2022-01-08 | Discharge: 2022-01-08 | Disposition: A | Discharge status: Home / Self Care | Payer: Self-pay | Source: Ambulatory Visit

## 2022-01-08 DIAGNOSIS — O3680X0 Pregnancy with inconclusive fetal viability, not applicable or unspecified: Secondary | ICD-10-CM | POA: Insufficient documentation

## 2022-01-08 DIAGNOSIS — O209 Hemorrhage in early pregnancy, unspecified: Secondary | ICD-10-CM | POA: Insufficient documentation

## 2022-01-08 DIAGNOSIS — Z3A01 Less than 8 weeks gestation of pregnancy: Secondary | ICD-10-CM | POA: Insufficient documentation

## 2022-01-08 DIAGNOSIS — Z3491 Encounter for supervision of normal pregnancy, unspecified, first trimester: Secondary | ICD-10-CM | POA: Insufficient documentation

## 2022-01-08 NOTE — Telephone Encounter (Addendum)
-----   Message from Marny Lowenstein, PA-C sent at 01/08/2022  3:53 PM EDT ----- ?Regarding: FW: Ultrasound Results ?Can someone please call the patient with the interpreter to let her know that her US shows a completed miscarriage? She should have a provider visit in 2-3 weeks and be given bleeding precautions.  ? ?Thank you,  ?Raynelle Fanning  ? ? ?Called pt with interpreter [name]. Results reviewed. Pt will return 01/15/22 for follow up with Mathis Fare, MD.  ? ?

## 2022-01-15 ENCOUNTER — Encounter: Payer: Self-pay | Admitting: Family Medicine

## 2022-01-15 ENCOUNTER — Ambulatory Visit (INDEPENDENT_AMBULATORY_CARE_PROVIDER_SITE_OTHER): Payer: Self-pay | Admitting: Family Medicine

## 2022-01-15 VITALS — BP 99/73 | HR 79 | Wt 169.6 lb

## 2022-01-15 DIAGNOSIS — K5900 Constipation, unspecified: Secondary | ICD-10-CM

## 2022-01-15 DIAGNOSIS — O039 Complete or unspecified spontaneous abortion without complication: Secondary | ICD-10-CM

## 2022-01-15 NOTE — Progress Notes (Signed)
? ?  GYNECOLOGY OFFICE VISIT NOTE ? ?History:  ? Kristine Bryant is a 36 y.o. G2P1011 here for follow up after miscarriage.  ? ?Diagnosed via ultrasound at [redacted]w[redacted]d with no fetal cardiac activity seen. Treated with Cytotec. Had heavy bleeding for 3 days after this which then stopped. She denies any pain or bleeding at this time. She is interested in conceiving and has questions about when to try to conceive again. She is also having constipation and is wondering what she can do to help with this.  ? ?Past Medical History:  ?Diagnosis Date  ? Diabetes mellitus without complication ([facility])   ? GDM  ? Pregnancy induced hypertension   ? Supervision of other normal pregnancy, antepartum 02/07/2020  ?  Nursing Staff Provider Office Location [facility] Dating  Early ultrasound  Language  Spanish  Anatomy US  Incomplete spine eval (repeat needed, see note) Anterior placenta, 9.7% by LMP (which is likely appropriate by [place] dating) Female  24th%tile at MFM Flu Vaccine  Pt requests to have at HD Genetic Screen  NIPS:   AFP:   First Screen:  Quad:  Declined   TDaP vaccine  ^see above Hgb A1C or  GTT Early  T  ? ? ?Past Surgical History:  ?Procedure Laterality Date  ? APPENDECTOMY    ? ?The following portions of the patient's history were reviewed and updated as appropriate: allergies, current medications, past family history, past medical history, past social history, past surgical history and problem list. ? ?Review of Systems:  ?Pertinent items noted in HPI and remainder of comprehensive ROS otherwise negative. ? ?Physical Exam:  ? ?BP 99/73   Pulse 79   Wt 169 lb 9.6 oz (76.9 kg)   LMP 10/30/2021   Breastfeeding Unknown   BMI 32.05 kg/m?  ? ?CONSTITUTIONAL: Well-developed, well-nourished female in no acute distress.  ?HEENT:  Normocephalic, atraumatic. EOMI, conjunctivae clear. ?CARDIOVASCULAR: Normal heart rate noted. ?RESPIRATORY: Normal work of breathing on room air.  ?ABDOMEN: Soft, nontender, nondistended, no masses  palpated.  ?NEUROLOGIC: Alert and oriented to person, place, and time. ?PSYCHIATRIC: Normal mood and affect. ? ?Assessment and Plan:  ? ?1. Miscarriage ?Normal exam today. Will obtain hCG level today to ensure that this has now normalized. If normal, will plan to restart POPs for contraception per patient preference. Will wait for about 3 months prior to trying to conceive again. Recommended prenatal vitamin daily. All questions and concerns addressed. Will follow up results.  ?- Beta hCG quant (ref lab) ? ?2. Constipation, unspecified constipation type ?Recommended Miralax daily until she starts to have normal BMs. Can then titrate as needed to keep stools regular and soft. Encouraged high fiber diet and plenty of hydration. Will reassess as needed. ? ?Please refer to After Visit Summary for other counseling recommendations.  ? ?In-person Spanish interpreter used for entirety of visit.  ? ?Return for follow up as needed.   ? ?Evalina Field, MD ?OB Fellow, Faculty Practice ?[place] ?01/15/2022 2:12 PM ? ?

## 2022-01-16 ENCOUNTER — Encounter: Payer: Self-pay | Admitting: Family Medicine

## 2022-01-16 ENCOUNTER — Other Ambulatory Visit: Payer: Self-pay | Admitting: Family Medicine

## 2022-01-16 DIAGNOSIS — Z30011 Encounter for initial prescription of contraceptive pills: Secondary | ICD-10-CM

## 2022-01-16 LAB — BETA HCG QUANT (REF LAB): hCG Quant: <1 m[IU]/mL

## 2022-01-16 MED ORDER — NORETHINDRONE 0.35 MG PO TABS: ORAL_TABLET | ORAL | 11 refills | Status: DC

## 2022-01-19 ENCOUNTER — Telehealth: Payer: Self-pay

## 2022-01-19 NOTE — Telephone Encounter (Addendum)
-----   Message from Genia Del, MD sent at 01/16/2022  9:01 PM EDT ----- ?Please call patient and let her know that hCG level has now returned back to normal. We can start birth control pills at this time like we discussed at her visit. I am sending in Micronor for her, which she has used in the past. Please let me know if she has further questions, thanks!  ? ?-Vilma Meckel  ? ?Called pt and left message with Spanish Interpreter Raquel M. That her results are normal no need for any more lab draws to please go to [facility] on [address] to pick up medication.  Re:pt needs one more call attempt.   ? ?Zak Gondek,RN  ?01/19/22 ?

## 2022-01-20 NOTE — Telephone Encounter (Addendum)
Called patient with assistance of Raquel Carrolyn Meiers, [employer].  ? ?Informed patient that Hcg levels are back to normal and that she can start taking the Micronor that has been sent to the pharmacy.  ? ?Patient asked if she can have sexual intercourse again, informed her to use condoms for the next 2 weeks of starting the OCP's if wanting to prevent pregnancy at this time. Patient voiced understanding.  ? ?Patient asked about taking PNV, informed her that is fine. Patient says she has PNV at home.  ?

## 2022-01-28 IMAGING — US US MFM OB DETAIL+14 WK
1 series · 13 of 28 positions shown · non-contrast
Comparison: none

[Series 1: us mfm ob detail+14 wk · 13 of 74 slices shown]
[im 3/74]
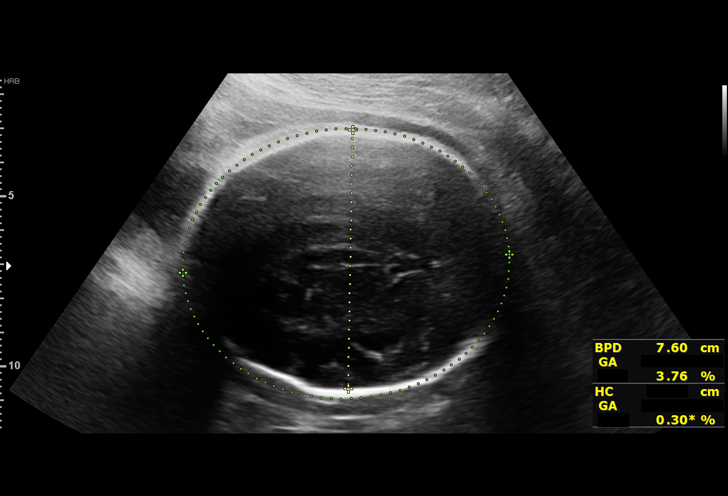
[im 9/74]
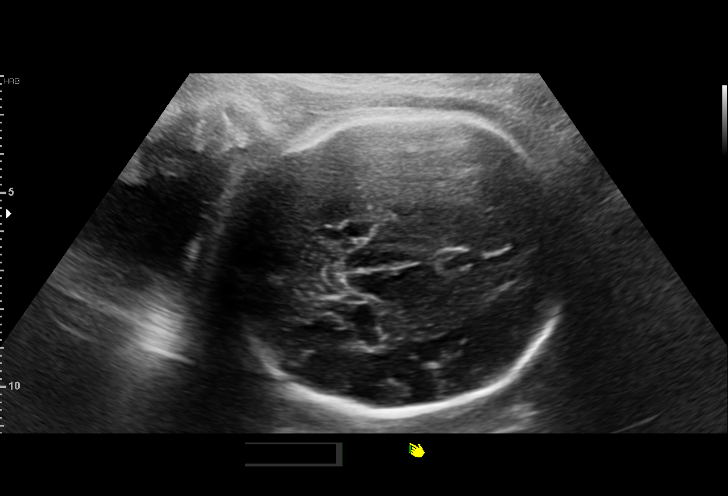
[im 14/74]
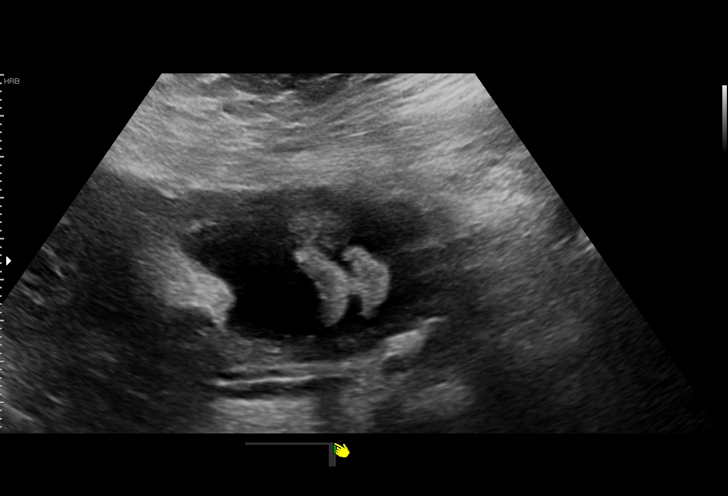
[im 19/74]
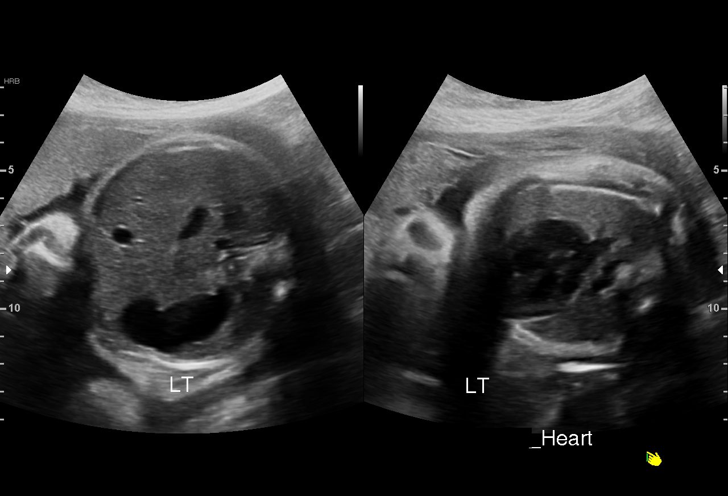
[im 25/74]
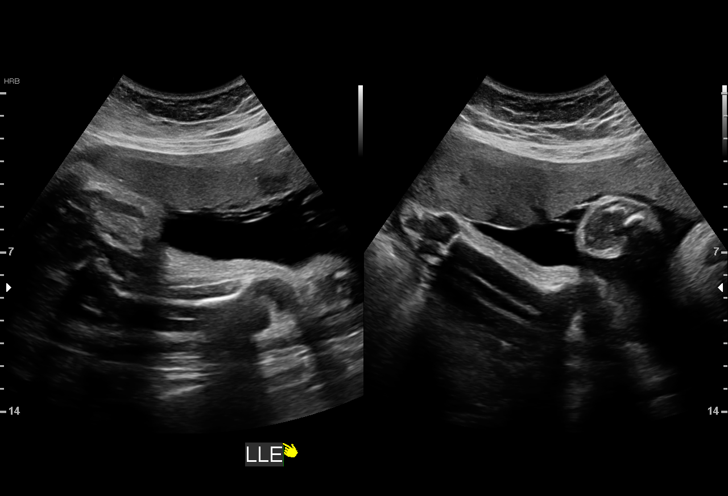
[im 30/74]
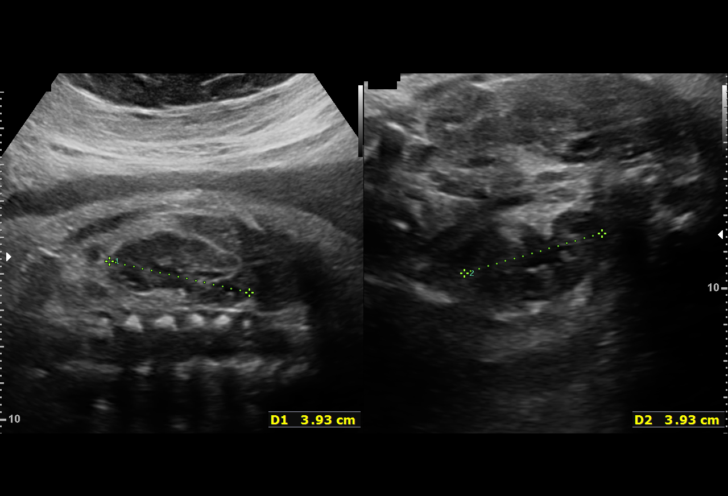
[im 38/74]
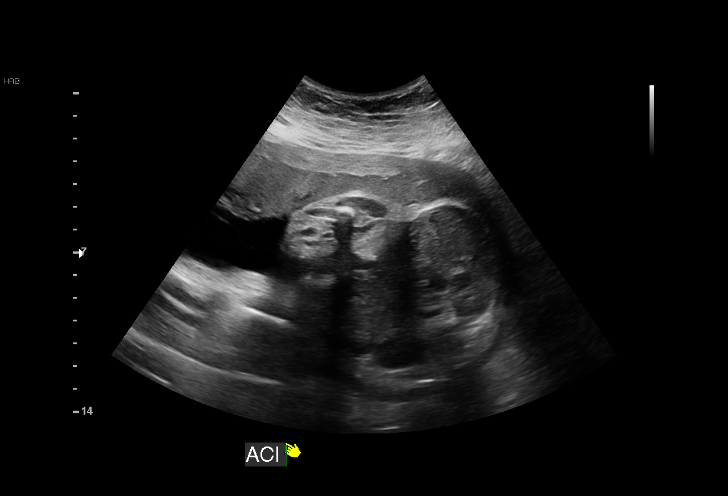
[im 44/74]
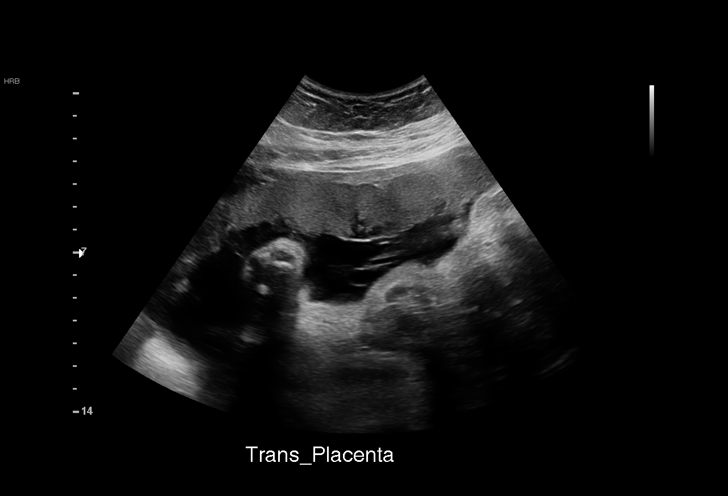
[im 49/74]
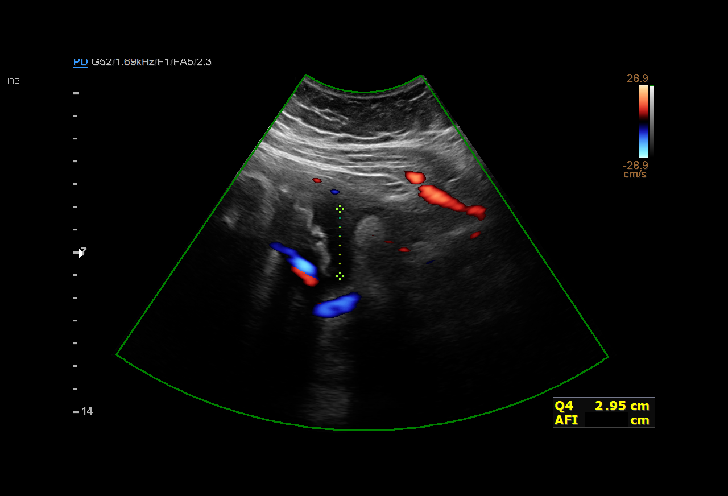
[im 55/74]
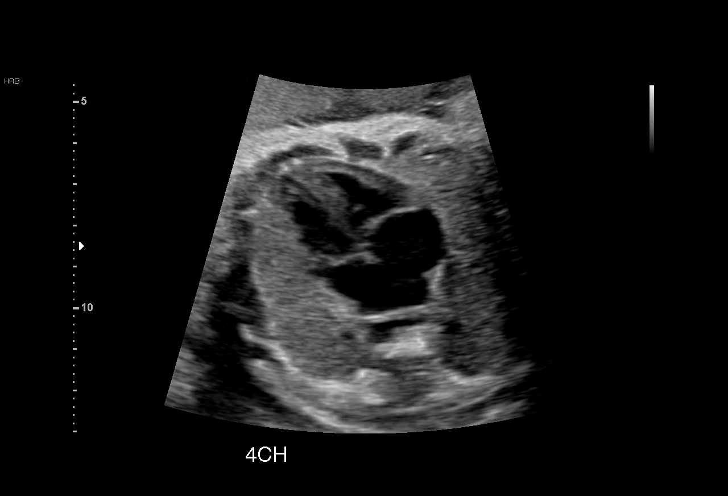
[im 60/74]
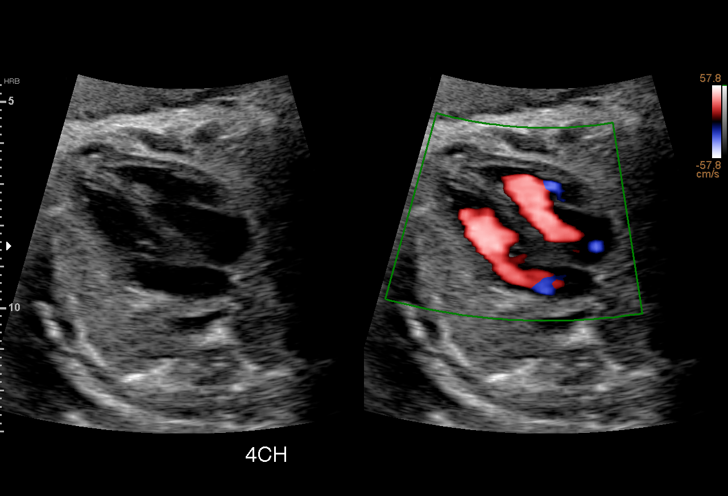
[im 65/74]
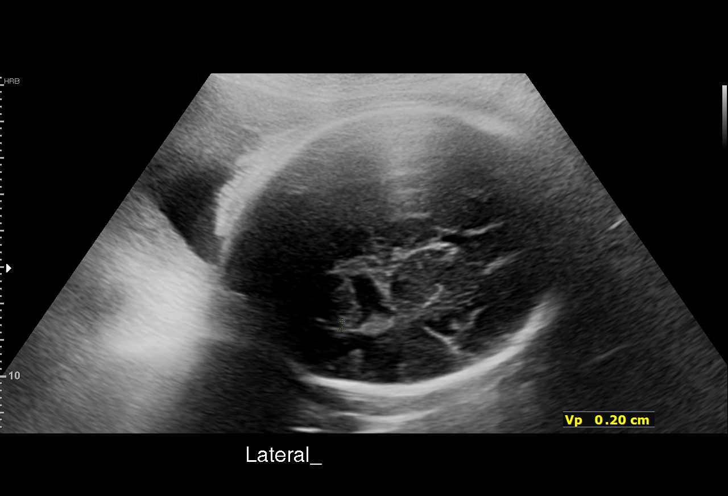
[im 71/74]
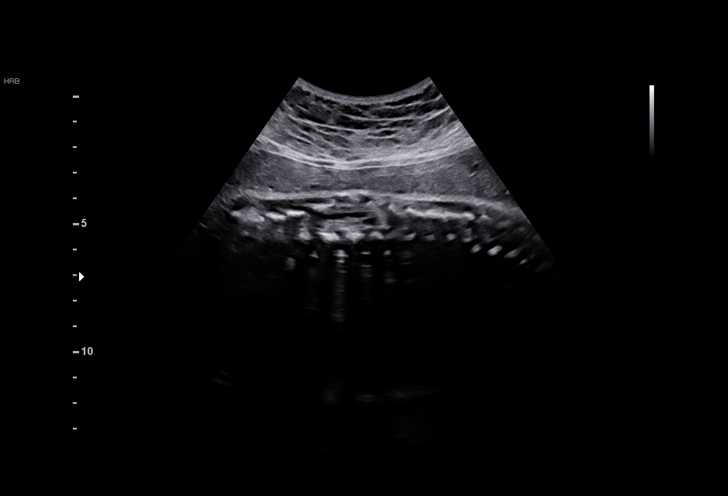

[13 of 28 positions shown; findings below may reference images not displayed]

Indications

 Gestational diabetes in pregnancy, diet
 controlled
 Encounter for antenatal screening for
 malformations
 32 weeks gestation of pregnancy
Vital Signs

 BMI:
Fetal Evaluation

 Num Of Fetuses:         1
 Fetal Heart Rate(bpm):  160
 Cardiac Activity:       Observed
 Presentation:           Cephalic
 Placenta:               Anterior
 P. Cord Insertion:      Visualized, central

 Amniotic Fluid
 AFI FV:      Within normal limits

 AFI Sum(cm)     %Tile       Largest Pocket(cm)
 11.44           28

 RUQ(cm)       RLQ(cm)       LUQ(cm)        LLQ(cm)
 5.12          2.95          0
Biometry

 BPD:        76  mm     G. Age:  30w 3d        3.7  %    CI:        75.58   %    70 - 86
                                                         FL/HC:      21.7   %    19.1 -
 HC:      277.2  mm     G. Age:  30w 2d        < 1  %    HC/AC:      0.96        0.96 -
 AC:      287.7  mm     G. Age:  32w 5d         60  %    FL/BPD:     79.2   %    71 - 87
 FL:       60.2  mm     G. Age:  31w 2d         13  %    FL/AC:      20.9   %    20 - 24
 LV:          2  mm

 Est. FW:    4970  gm      4 lb 2 oz     24  %
OB History

 Gravidity:    1
Gestational Age

 U/S Today:     31w 1d                                        EDD:   08/22/20
 Best:          32w 3d     Det. By:  Previous Ultrasound      EDD:   08/13/20
                                     (12/27/19)
Anatomy

 Cranium:               Appears normal         Aortic Arch:            Not well visualized
 Cavum:                 Appears normal         Ductal Arch:            Not well visualized
 Ventricles:            Appears normal         Diaphragm:              Appears normal
 Choroid Plexus:        Appears normal         Stomach:                Appears normal, left
                                                                       sided
 Cerebellum:            Appears normal         Abdomen:                Appears normal
 Posterior Fossa:       Appears normal         Abdominal Wall:         Not well visualized
 Nuchal Fold:           Appears normal         Cord Vessels:           Appears normal (3
                                                                       vessel cord)
 Face:                  Appears normal         Kidneys:                Appear normal
                        (orbits and profile)
 Lips:                  Appears normal         Bladder:                Appears normal
 Thoracic:              Appears normal         Spine:                  Not well visualized
 Heart:                 Appears normal         Upper Extremities:      Appears normal
                        (4CH, axis, and
                        situs)
 RVOT:                  Appears normal         Lower Extremities:      Appears normal
 LVOT:                  Appears normal

 Other:  Technically difficult due to advanced GA and fetal position.
Cervix Uterus Adnexa

 Cervix
 Not visualized (advanced GA >70wks)
Comments

 This patient was seen for an ultrasound exam due to recently
 diagnosed diet-controlled gestational diabetes.  [facility]
 [facility]
 She was informed that the fetal growth and amniotic fluid
 level appears appropriate for her gestational age.
 The views of the fetal anatomy were limited today due to her
 advanced gestational age.
 The implications and management of diabetes in pregnancy
 was discussed in detail with the patient. She was advised that
 our goals for her fingerstick values are fasting values of 90-95
 or less and two-hour postprandials of 120 or less.  Should her
 fingerstick values be above these values, she may have to be
 started on insulin or metformin to help her achieve better
 glycemic control. The patient was advised that getting her
 fingerstick values as close to these goals as possible would
 provide her with the most optimal obstetrical outcome.
 A follow up exam was scheduled in 4 weeks to assess the
 fetal growth.  Weekly fetal testing would be indicated should
 she require insulin or Metformin for treatment of gestational
 diabetes.
 All conversations were held with the patient today with the
 help of a Spanish interpreter.

## 2022-06-20 ENCOUNTER — Encounter (HOSPITAL_COMMUNITY): Payer: Self-pay | Admitting: *Deleted

## 2022-06-20 ENCOUNTER — Inpatient Hospital Stay (HOSPITAL_COMMUNITY)
Admission: EM | Admit: 2022-06-20 | Discharge: 2022-06-21 | Disposition: A | Discharge status: Home / Self Care | Payer: Medicaid Other | Attending: Obstetrics & Gynecology | Admitting: Obstetrics & Gynecology

## 2022-06-20 ENCOUNTER — Other Ambulatory Visit: Payer: Self-pay

## 2022-06-20 DIAGNOSIS — R319 Hematuria, unspecified: Secondary | ICD-10-CM | POA: Diagnosis present

## 2022-06-20 DIAGNOSIS — Z3A01 Less than 8 weeks gestation of pregnancy: Secondary | ICD-10-CM | POA: Insufficient documentation

## 2022-06-20 DIAGNOSIS — R1031 Right lower quadrant pain: Secondary | ICD-10-CM | POA: Diagnosis present

## 2022-06-20 DIAGNOSIS — O26891 Other specified pregnancy related conditions, first trimester: Secondary | ICD-10-CM | POA: Diagnosis not present

## 2022-06-20 DIAGNOSIS — O26899 Other specified pregnancy related conditions, unspecified trimester: Secondary | ICD-10-CM

## 2022-06-20 LAB — URINALYSIS, ROUTINE W REFLEX MICROSCOPIC
Bacteria, UA: NONE SEEN
Bilirubin Urine: NEGATIVE
Glucose, UA: NEGATIVE mg/dL
Ketones, ur: NEGATIVE mg/dL
Leukocytes,Ua: NEGATIVE
Nitrite: NEGATIVE
Protein, ur: NEGATIVE mg/dL
RBC / HPF: >50 RBC/hpf — ABNORMAL HIGH (ref 0–5)
Specific Gravity, Urine: 1.011 (ref 1.005–1.030)
pH: 6 (ref 5.0–8.0)

## 2022-06-20 LAB — COMPREHENSIVE METABOLIC PANEL
ALT: 15 U/L (ref 0–44)
AST: 20 U/L (ref 15–41)
Albumin: 4.6 g/dL (ref 3.5–5.0)
Alkaline Phosphatase: 64 U/L (ref 38–126)
Anion gap: 10 (ref 5–15)
BUN: 6 mg/dL (ref 6–20)
CO2: 25 mmol/L (ref 22–32)
Calcium: 9.7 mg/dL (ref 8.9–10.3)
Chloride: 105 mmol/L (ref 98–111)
Creatinine, Ser: 0.65 mg/dL (ref 0.44–1.00)
GFR, Estimated: >60 mL/min (ref >60)
Glucose, Bld: 96 mg/dL (ref 70–99)
Potassium: 4 mmol/L (ref 3.5–5.1)
Sodium: 140 mmol/L (ref 135–145)
Total Bilirubin: 0.2 mg/dL — ABNORMAL LOW (ref 0.3–1.2)
Total Protein: 7.5 g/dL (ref 6.5–8.1)

## 2022-06-20 LAB — CBC
HCT: 36.6 % (ref 36.0–46.0)
Hemoglobin: 12.4 g/dL (ref 12.0–15.0)
MCH: 30 pg (ref 26.0–34.0)
MCHC: 33.9 g/dL (ref 30.0–36.0)
MCV: 88.4 fL (ref 80.0–100.0)
Platelets: 238 10*3/uL (ref 150–400)
RBC: 4.14 MIL/uL (ref 3.87–5.11)
RDW: 12.8 % (ref 11.5–15.5)
WBC: 6.8 10*3/uL (ref 4.0–10.5)
nRBC: 0 % (ref 0.0–0.2)

## 2022-06-20 LAB — I-STAT BETA HCG BLOOD, ED (MC, WL, AP ONLY): I-stat hCG, quantitative: 68.1 m[IU]/mL — ABNORMAL HIGH (ref <5)

## 2022-06-20 LAB — LIPASE, BLOOD: Lipase: 33 U/L (ref 11–51)

## 2022-06-20 MED ORDER — KETOROLAC TROMETHAMINE 60 MG/2ML IM SOLN
60.0000 mg | Freq: Once | INTRAMUSCULAR | Status: AC
  Administered 2022-06-21: 60 mg via INTRAMUSCULAR
  Filled 2022-06-20: qty 2

## 2022-06-20 NOTE — ED Triage Notes (Addendum)
Pt is here with RLQ pain that started yesterday. Pt states LMP was last week on Saturday.  Pt denies vaginal bleeding, discharge, or any urinary symptoms. Pt reports she is cold and has nausea.  Pt also has this itching that she gets every night. Pt states she is dizzy from the pain.  Used interpretor [phone] to gain all information.

## 2022-06-20 NOTE — MAU Note (Addendum)
.  Kristine Bryant is a 36 y.o. at Unknown here in MAU reporting: transfer from [facility] with c/o right lower ABD and flank pain, nausea and dizziness when she feels the pain since yesterday am, and worse today. Pt reports bright red VB that started today, with wiping , and has continued, wearing a pad. Pt took Motrin 600mg  today @1800  with no relief.  Pt report she was unsure of being preg. Pt states she though she had her period 10/7-10/10, and it was normal.  Pt denies LOF, dysuria or burning with urination.  Onset of complaint: yesterday am Pain score: 10/10 Vitals:   06/20/22 2245 06/20/22 2258  BP: (!) 142/99 (!) 142/99  Pulse: 82 81  Resp:  18  Temp:  98.2 F (36.8 C)  SpO2: 99% 99%      Lab orders placed from triage:

## 2022-06-20 NOTE — ED Notes (Signed)
MAU called and report given.  Patient to be transported to MAU for further care

## 2022-06-20 NOTE — ED Provider Notes (Signed)
[facility] EMERGENCY DEPARTMENT Provider Note   CSN: [id number] Arrival date & time: 06/20/22  1944     History  Chief Complaint  Patient presents with   Abdominal Pain   Nausea    Kristine Bryant is a 36 y.o. female.  36 year old female presents with complaint of RLQ pain onset yesterday morning, worse today, pain is intermittent- currently severe. Pain is worse with walking, does not radiate. With nausea, dizziness, no vomiting, chills, no fevers, denies changes in bowel habits. Reports blood in her urine since this afternoon.  No history of kidney stones. Prior abdominal surgeries- appendectomy. G2P1 (38 mos old) Not breastfeeding.  LMP 06/13/22- normal cycle  A language interpreter was used (spanish).  Abdominal Pain      Home Medications Prior to Admission medications   Medication Sig Start Date End Date Taking? Authorizing Provider  acetaminophen (TYLENOL) 650 MG CR tablet Take 650 mg by mouth every 8 (eight) hours as needed for pain. Patient not taking: Reported on 01/15/2022    [provider]  acetaminophen-codeine (TYLENOL #3) 300-30 MG tablet Take 1-2 tablets by mouth every 6 (six) hours as needed for moderate pain. Patient not taking: Reported on 01/15/2022 12/27/21   Marylene Land, CNM  ibuprofen (ADVIL) 800 MG tablet Take 1 tablet (800 mg total) by mouth 3 (three) times daily. 12/27/21   Marylene Land, CNM  norethindrone (MICRONOR) 0.35 MG tablet TOME 1 PASTILLA POR VIA ORAL UNA VEZ AL DIA 01/16/22 01/16/23  Worthy Rancher, MD  Prenatal Vit-Fe Fumarate-FA (PRENATAL MULTIVITAMIN) TABS tablet Take 1 tablet by mouth daily at 12 noon.    [provider]  promethazine (PHENERGAN) 12.5 MG tablet Take 1 tablet (12.5 mg total) by mouth every 6 (six) hours as needed for nausea or vomiting. Patient not taking: Reported on 01/15/2022 12/27/21   Marylene Land, CNM      Allergies    Patient  has no known allergies.    Review of Systems   Review of Systems  Gastrointestinal:  Positive for abdominal pain.   Negative except as per HPI Physical Exam Updated Vital Signs BP (!) 128/112 (BP Location: Right Arm)   Pulse 87   Temp 98.7 F (37.1 C)   Resp 20   LMP 06/13/2022 (Exact Date)   SpO2 100%   Breastfeeding Unknown  Physical Exam Vitals and nursing note reviewed.  Constitutional:      General: She is not in acute distress.    Appearance: She is well-developed. She is not diaphoretic.  HENT:     Head: Normocephalic and atraumatic.  Cardiovascular:     Rate and Rhythm: Normal rate and regular rhythm.     Heart sounds: Normal heart sounds.  Pulmonary:     Effort: Pulmonary effort is normal.     Breath sounds: Normal breath sounds.  Abdominal:     Palpations: Abdomen is soft.     Tenderness: There is abdominal tenderness in the right lower quadrant and suprapubic area. There is no right CVA tenderness or left CVA tenderness.  Skin:    General: Skin is warm and dry.     Findings: No erythema or rash.  Neurological:     Mental Status: She is alert and oriented to person, place, and time.  Psychiatric:        Behavior: Behavior normal.     ED Results / Procedures / Treatments   Labs (all labs ordered are listed, but only  abnormal results are displayed) Labs Reviewed  COMPREHENSIVE METABOLIC PANEL - Abnormal; Notable for the following components:      Result Value   Total Bilirubin 0.2 (*)    All other components within normal limits  URINALYSIS, ROUTINE W REFLEX MICROSCOPIC - Abnormal; Notable for the following components:   Color, Urine STRAW (*)    Hgb urine dipstick LARGE (*)    RBC / HPF >50 (*)    All other components within normal limits  I-STAT BETA HCG BLOOD, ED ([facility], [facility], [facility] ONLY) - Abnormal; Notable for the following components:   I-stat hCG, quantitative 68.1 (*)    All other components within normal limits  LIPASE, BLOOD  CBC     EKG None  Radiology No results found.  Procedures Procedures    Medications Ordered in ED Medications - No data to display  ED Course/ Medical Decision Making/ A&P Clinical Course as of 06/20/22 2252  Sat Jun 20, 2022  2235 I-stat hCG, quantitative(!): 68.1 [LM]    Clinical Course User Index [LM] Roque Lias                           Medical Decision Making Amount and/or Complexity of Data Reviewed Labs: ordered. Decision-making details documented in ED Course.  Risk Decision regarding hospitalization.   36 year old female presents with concern for right lower quadrant pain onset yesterday as above.  Interpreter is used for her evaluation and history today.  She is found to have right lower quadrant tenderness, no guarding or rebound, no CVA tenderness.  hCG is elevated at 68.  CMP is unremarkable, CBC normal, specifically normal H&H, normal WBC.  Lipase normal.  Urinalysis does reveal large hemoglobin greater than 50 red cells.  Consider possible kidney stone.  With patient's positive pregnancy test, question ectopic pregnancy, consider torsion.  Case was discussed with MAU APP Anderson Malta who accepts patient in transfer to MAU for further work-up today.        Final Clinical Impression(s) / ED Diagnoses Final diagnoses:  Right lower quadrant pain  Hematuria, unspecified type  Less than [redacted] weeks gestation of pregnancy    Rx / DC Orders ED Discharge Orders     None         Roque Lias 06/20/22 2252    Noemi Chapel, MD 06/23/22 1223

## 2022-06-21 ENCOUNTER — Inpatient Hospital Stay (HOSPITAL_COMMUNITY): Payer: Medicaid Other

## 2022-06-21 LAB — HIV ANTIBODY (ROUTINE TESTING W REFLEX): HIV Screen 4th Generation wRfx: NONREACTIVE

## 2022-06-21 LAB — HCG, QUANTITATIVE, PREGNANCY: hCG, Beta Chain, Quant, S: 77 m[IU]/mL — ABNORMAL HIGH (ref <5)

## 2022-06-21 NOTE — MAU Provider Note (Signed)
History     CSN: [id number]  Arrival date and time: 06/20/22 1944   Event Date/Time   First Provider Initiated Contact with Patient 06/21/22 0138      Chief Complaint  Patient presents with   Abdominal Pain   Nausea   HPI  Kristine Bryant is a 36 y.o. female [phone] @ [redacted]w[redacted]d with a history of appendectomy, here in MAU with complaints of abdominal pain. The pain started yesterday and has progressively gotten worse during the day. She has not tried anything for the pain. She rates the pain 8/10. She reports very light red spotting. Not period like. She reports the pain is worse in the RLQ and around to her lower back. She denies fever or vomiting. Occasional nausea.    OB History     Gravida  4   Para  1   Term  1   Preterm  0   AB  1   Living  1      SAB      IAB  1   Ectopic      Multiple  0   Live Births  1           Past Medical History:  Diagnosis Date   Diabetes mellitus without complication (HCC)    GDM   Pregnancy induced hypertension    Supervision of other normal pregnancy, antepartum 02/07/2020    Nursing Staff Provider Office Location [facility] Dating  Early ultrasound  Language  Spanish  Anatomy US  Incomplete spine eval (repeat needed, see note) Anterior placenta, 9.7% by LMP (which is likely appropriate by [place] dating) Female  24th%tile at MFM Flu Vaccine  Pt requests to have at HD Genetic Screen  NIPS:   AFP:   First Screen:  Quad:  Declined   TDaP vaccine  ^see above Hgb A1C or  GTT Early  T    Past Surgical History:  Procedure Laterality Date   APPENDECTOMY      History reviewed. No pertinent family history.  Social History   Tobacco Use   Smoking status: Never   Smokeless tobacco: Never  Vaping Use   Vaping Use: Never used  Substance Use Topics   Alcohol use: Not Currently    Comment: SOCIAL- LITTLE   Drug use: Never    Allergies: No Known Allergies  Medications Prior to Admission  Medication Sig Dispense Refill Last  Dose   ibuprofen (ADVIL) 600 MG tablet Take 600 mg by mouth every 6 (six) hours as needed.   06/20/2022 at 1800   acetaminophen (TYLENOL) 650 MG CR tablet Take 650 mg by mouth every 8 (eight) hours as needed for pain. (Patient not taking: Reported on 01/15/2022)      Prenatal Vit-Fe Fumarate-FA (PRENATAL MULTIVITAMIN) TABS tablet Take 1 tablet by mouth daily at 12 noon.      Results for orders placed or performed during the hospital encounter of 06/20/22 (from the past 48 hour(s))  Lipase, blood     Status: None   Collection Time: 06/20/22  9:43 PM  Result Value Ref Range   Lipase 33 11 - 51 U/L    Comment: Performed at [facility], [place] [address], [place], [place] [address]  Comprehensive metabolic panel     Status: Abnormal   Collection Time: 06/20/22  9:43 PM  Result Value Ref Range   Sodium 140 135 - 145 mmol/L   Potassium 4.0 3.5 - 5.1 mmol/L   Chloride 105 98 -  111 mmol/L   CO2 25 22 - 32 mmol/L   Glucose, Bld 96 70 - 99 mg/dL    Comment: Glucose reference range applies only to samples taken after fasting for at least 8 hours.   BUN 6 6 - 20 mg/dL   Creatinine, Ser 6.31 0.44 - 1.00 mg/dL   Calcium 9.7 8.9 - 49.7 mg/dL   Total Protein 7.5 6.5 - 8.1 g/dL   Albumin 4.6 3.5 - 5.0 g/dL   AST 20 15 - 41 U/L   ALT 15 0 - 44 U/L   Alkaline Phosphatase 64 38 - 126 U/L   Total Bilirubin 0.2 (L) 0.3 - 1.2 mg/dL   GFR, Estimated >02 >63 mL/min    Comment: (NOTE) Calculated using the CKD-EPI Creatinine Equation (2021)    Anion gap 10 5 - 15    Comment: Performed at [facility], [address] [place], [place] [address]  CBC     Status: None   Collection Time: 06/20/22  9:43 PM  Result Value Ref Range   WBC 6.8 4.0 - 10.5 K/uL   RBC 4.14 3.87 - 5.11 MIL/uL   Hemoglobin 12.4 12.0 - 15.0 g/dL   HCT 50.2 77.4 - 12.8 %   MCV 88.4 80.0 - 100.0 fL   MCH 30.0 26.0 - 34.0 pg   MCHC 33.9 30.0 - 36.0 g/dL   RDW 78.6 76.7 - 20.9 %   Platelets 238 150 - 400 K/uL   nRBC 0.0  0.0 - 0.2 %    Comment: Performed at [facility], [address], [place], [place] [address]  Urinalysis, Routine w reflex microscopic     Status: Abnormal   Collection Time: 06/20/22  9:50 PM  Result Value Ref Range   Color, Urine STRAW (A) YELLOW   APPearance CLEAR CLEAR   Specific Gravity, Urine 1.011 1.005 - 1.030   pH 6.0 5.0 - 8.0   Glucose, UA NEGATIVE NEGATIVE mg/dL   Hgb urine dipstick LARGE (A) NEGATIVE   Bilirubin Urine NEGATIVE NEGATIVE   Ketones, ur NEGATIVE NEGATIVE mg/dL   Protein, ur NEGATIVE NEGATIVE mg/dL   Nitrite NEGATIVE NEGATIVE   Leukocytes,Ua NEGATIVE NEGATIVE   RBC / HPF >50 (H) 0 - 5 RBC/hpf   WBC, UA 0-5 0 - 5 WBC/hpf   Bacteria, UA NONE SEEN NONE SEEN   Squamous Epithelial / LPF 0-5 0 - 5    Comment: Performed at [facility], [address] [place], [place] [address]  I-Stat beta hCG blood, ED     Status: Abnormal   Collection Time: 06/20/22 10:21 PM  Result Value Ref Range   I-stat hCG, quantitative 68.1 (H) <5 mIU/mL   Comment 3            Comment:   GEST. AGE      CONC.  (mIU/mL)   <=1 WEEK        5 - 50     2 WEEKS       50 - 500     3 WEEKS       100 - 10,000     4 WEEKS     1,000 - 30,000        FEMALE AND NON-PREGNANT FEMALE:     LESS THAN 5 mIU/mL   hCG, quantitative, pregnancy     Status: Abnormal   Collection Time: 06/21/22 12:10 AM  Result Value Ref Range   hCG, Beta Chain, Quant, S 77 (  H) <5 mIU/mL    Comment:          GEST. AGE      CONC.  (mIU/mL)   <=1 WEEK        5 - 50     2 WEEKS       50 - 500     3 WEEKS       100 - 10,000     4 WEEKS     1,000 - 30,000     5 WEEKS     3,500 - 115,000   6-8 WEEKS     12,000 - 270,000    12 WEEKS     15,000 - 220,000        FEMALE AND NON-PREGNANT FEMALE:     LESS THAN 5 mIU/mL Performed at [facility], [address], [place], [place] [address]      US Renal  Result Date: 06/21/2022 CLINICAL DATA:  Right lower quadrant pain, hematuria EXAM: RENAL / URINARY  TRACT ULTRASOUND COMPLETE COMPARISON:  None Available. FINDINGS: Right Kidney: Renal measurements: 10.3 x 4.3 x 5.4 cm = volume: 125 mL. Echogenicity within normal limits. No mass or hydronephrosis visualized. Left Kidney: Renal measurements: 10.8 x 6.2 x 5.2 cm = volume: 182 mL. Echogenicity within normal limits. No mass or hydronephrosis visualized. Bladder: Underdistended. Other: None. IMPRESSION: Negative renal ultrasound. No hydronephrosis. Electronically Signed   By: Charline Bills M.D.   On: 06/21/2022 01:14   US OB LESS THAN 14 WEEKS WITH OB TRANSVAGINAL  Result Date: 06/21/2022 CLINICAL DATA:  Abdominal pain, vaginal bleeding EXAM: OBSTETRIC <14 WK [place] AND TRANSVAGINAL OB US TECHNIQUE: Both transabdominal and transvaginal ultrasound examinations were performed for complete evaluation of the gestation as well as the maternal uterus, adnexal regions, and pelvic cul-de-sac. Transvaginal technique was performed to assess early pregnancy. COMPARISON:  01/08/2022 FINDINGS: Intrauterine gestational sac: None Yolk sac:  Not Visualized. Embryo:  Not Visualized. Cardiac Activity: Not Visualized. Heart Rate:   bpm MSD:   mm    w     d CRL:    mm    w    d                  [place] EDC: Subchorionic hemorrhage:  None visualized. Maternal uterus/adnexae: No adnexal mass or free fluid. Uterus is retroverted. IMPRESSION: No intrauterine pregnancy visualized. Differential considerations would include early intrauterine pregnancy too early to visualize, spontaneous abortion, or occult ectopic pregnancy. Recommend close clinical followup and serial quantitative beta HCGs and ultrasounds. Electronically Signed   By: Charlett Nose M.D.   On: 06/21/2022 01:10     Review of Systems  Gastrointestinal:  Positive for abdominal pain.  Genitourinary:  Positive for vaginal bleeding. Negative for vaginal pain.  Neurological:  Negative for dizziness and headaches.   Physical Exam   Blood pressure 112/76, pulse 82, temperature  98.2 F (36.8 C), temperature source Oral, resp. rate 18, height 5\' 2"  (1.575 m), weight 75.6 kg, last menstrual period 06/13/2022, SpO2 100 %, unknown if currently breastfeeding.  Physical Exam Constitutional:      General: She is not in acute distress.    Appearance: She is well-developed. She is not ill-appearing, toxic-appearing or diaphoretic.  HENT:     Head: Normocephalic.  Abdominal:     Palpations: Abdomen is soft.     Tenderness: There is abdominal tenderness in the right lower quadrant and suprapubic area. There is guarding. There is no rebound.  Neurological:     Mental Status: She is alert and oriented to person, place, and time.  Psychiatric:        Mood and Affect: Mood normal.    MAU Course  Procedures None  MDM  O positive blood type  HIV, CBC, Hcg, ABO US OB transvaginal    Assessment and Plan   A:  Right lower quadrant pain  Less than [redacted] weeks gestation of pregnancy  Abdominal pain during pregnancy, antepartum   P:  Dc home. Return to MAU if symptoms worsen Ectopic precautions [facility] @ 1030 for follow up quant. Ectopic precautions.  Venia Carbon I, NP 06/21/2022 1:57 AM

## 2022-06-23 ENCOUNTER — Ambulatory Visit: Payer: Medicaid Other

## 2022-06-24 ENCOUNTER — Other Ambulatory Visit: Payer: Self-pay

## 2022-06-24 ENCOUNTER — Ambulatory Visit: Payer: Medicaid Other | Admitting: *Deleted

## 2022-06-24 VITALS — BP 114/73 | HR 87 | Ht 62.0 in

## 2022-06-24 DIAGNOSIS — O039 Complete or unspecified spontaneous abortion without complication: Secondary | ICD-10-CM

## 2022-06-24 DIAGNOSIS — O3680X0 Pregnancy with inconclusive fetal viability, not applicable or unspecified: Secondary | ICD-10-CM

## 2022-06-24 LAB — BETA HCG QUANT (REF LAB): hCG Quant: 38 m[IU]/mL

## 2022-06-24 NOTE — Progress Notes (Signed)
Pt presents for stat BHCG.  She reports light amount of pain - less than when @ hospital on 10/15. She has no bleeding however pt remarked that she had significant amount of bleeding prior to evaluation @ MAU on 10/15. Pt was advised that she will be notified later today with test results and next steps in care. She should return to MAU if she develops heavy vaginal bleeding or increased abdominal pain. Pt voiced understanding.  1635  BHCG results reviewed w/Dr. Dione Plover who finds decrease in hormone level which is consistent with miscarriage.  Pt needs non-stat BHCG in one week. I called pt w/interpreter [name] Royal. She was notified of results and need for follow up lab in one week.  She voiced understanding and agreed to lab appt on 10/24 @ 1100. Pt also requested office appointment to help her with birth control. I advised that we will schedule follow up appt w/provider once her hormone level has returned to <5.

## 2022-06-30 ENCOUNTER — Other Ambulatory Visit: Payer: Self-pay

## 2022-06-30 ENCOUNTER — Other Ambulatory Visit: Payer: Medicaid Other

## 2022-06-30 DIAGNOSIS — O039 Complete or unspecified spontaneous abortion without complication: Secondary | ICD-10-CM

## 2022-07-01 LAB — BETA HCG QUANT (REF LAB): hCG Quant: 12 m[IU]/mL

## 2022-07-09 ENCOUNTER — Telehealth: Payer: Self-pay | Admitting: Family Medicine

## 2022-07-09 DIAGNOSIS — O039 Complete or unspecified spontaneous abortion without complication: Secondary | ICD-10-CM

## 2022-07-09 NOTE — Telephone Encounter (Signed)
Patient is calling regarding results that she received on mychart.

## 2022-07-09 NOTE — Telephone Encounter (Signed)
Call placed to patient with interpreter [name]. Spoke with pt. Pt given results of 10/24 beta per Dr Dione Plover. Pt verbalized understanding and is agreeable to plan of care. Pt scheduled for non stat beta on 11/3 at 10am. Pt agreeable to date and time of appt.  Colletta Maryland, RNC

## 2022-07-10 ENCOUNTER — Other Ambulatory Visit: Payer: Medicaid Other

## 2022-07-10 ENCOUNTER — Other Ambulatory Visit: Payer: Self-pay

## 2022-07-10 DIAGNOSIS — O039 Complete or unspecified spontaneous abortion without complication: Secondary | ICD-10-CM

## 2022-07-11 LAB — BETA HCG QUANT (REF LAB): hCG Quant: <1 m[IU]/mL

## 2022-07-13 ENCOUNTER — Telehealth: Payer: Self-pay | Admitting: *Deleted

## 2022-07-13 NOTE — Telephone Encounter (Addendum)
-----   Message from Clarnce Flock, MD sent at 07/11/2022  6:05 PM EDT ----- PUL hcg trend complete, no further testing required, please notify patient by phone  11/6  1540  Called pt w/interpreter Rosemarie Ax and she did not answer.

## 2022-07-16 ENCOUNTER — Telehealth: Payer: Self-pay | Admitting: Family Medicine

## 2022-07-16 NOTE — Telephone Encounter (Signed)
Called patient with Kristine Bryant and informed her of normal bhcg results. Patient verbalized understanding and asked what she is going to do about birth control. Told patient I can have someone from the front office call her with an appt to discuss birth control. Patient verbalized understanding and had no other questions.

## 2022-07-16 NOTE — Telephone Encounter (Signed)
Patient called in wanting to get the results from her lab work.

## 2022-08-25 ENCOUNTER — Ambulatory Visit: Payer: Medicaid Other | Admitting: Family Medicine

## 2022-08-27 ENCOUNTER — Ambulatory Visit (INDEPENDENT_AMBULATORY_CARE_PROVIDER_SITE_OTHER): Payer: Medicaid Other | Admitting: Obstetrics and Gynecology

## 2022-08-27 ENCOUNTER — Encounter: Payer: Self-pay | Admitting: Obstetrics and Gynecology

## 2022-08-27 ENCOUNTER — Other Ambulatory Visit: Payer: Self-pay

## 2022-08-27 VITALS — BP 115/68 | HR 90

## 2022-08-27 DIAGNOSIS — Z30017 Encounter for initial prescription of implantable subdermal contraceptive: Secondary | ICD-10-CM

## 2022-08-27 DIAGNOSIS — Z789 Other specified health status: Secondary | ICD-10-CM

## 2022-08-27 LAB — POCT PREGNANCY, URINE: Preg Test, Ur: NEGATIVE

## 2022-08-27 MED ORDER — ETONOGESTREL 68 MG ~~LOC~~ IMPL
68.0000 mg | DRUG_IMPLANT | Freq: Once | SUBCUTANEOUS | Status: AC
  Administered 2022-08-27: 68 mg via SUBCUTANEOUS

## 2022-08-28 NOTE — Progress Notes (Signed)
GYNECOLOGY VISIT  Patient name: Kristine Bryant MRN [id number]  Date of birth: [birth year] Chief Complaint:   Contraception  History:  Kristine Bryant is a 36 y.o. G4P1011 being seen today for nexplanon insertion. Has already reviewed contraceptive options and is sure she would like to proceed with nexplanon insertion.    Past Medical History:  Diagnosis Date   Diabetes mellitus without complication (HCC)    GDM   Pregnancy induced hypertension    Supervision of other normal pregnancy, antepartum 02/07/2020    Nursing Staff Provider Office Location [facility] Dating  Early ultrasound  Language  Spanish  Anatomy US  Incomplete spine eval (repeat needed, see note) Anterior placenta, 9.7% by LMP (which is likely appropriate by [place] dating) Female  24th%tile at MFM Flu Vaccine  Pt requests to have at HD Genetic Screen  NIPS:   AFP:   First Screen:  Quad:  Declined   TDaP vaccine  ^see above Hgb A1C or  GTT Early  T    Past Surgical History:  Procedure Laterality Date   APPENDECTOMY      The following portions of the patient's history were reviewed and updated as appropriate: allergies, current medications, past family history, past medical history, past social history, past surgical history and problem list.    Review of Systems:  Pertinent items are noted in HPI. Comprehensive review of systems was otherwise negative.   Objective:  Physical Exam BP 115/68   Pulse 90   LMP 06/13/2022 (Exact Date)   Breastfeeding Unknown    Physical Exam Vitals and nursing note reviewed.  Constitutional:      Appearance: Normal appearance.  HENT:     Head: Normocephalic and atraumatic.  Cardiovascular:     Rate and Rhythm: Normal rate and regular rhythm.  Pulmonary:     Effort: Pulmonary effort is normal.     Breath sounds: Normal breath sounds.  Skin:    General: Skin is warm and dry.  Neurological:     General: No focal deficit present.     Mental Status: She is alert.   Psychiatric:        Mood and Affect: Mood normal.        Behavior: Behavior normal.        Thought Content: Thought content normal.        Judgment: Judgment normal.    Nexplanon Insertion Procedure Patient identified, informed consent performed, consent signed.   Patient does understand that irregular bleeding is a very common side effect of this medication. She was advised to have backup contraception for one week after placement. Pregnancy test in clinic today was negative.  Appropriate time out taken.  Patient's left arm was prepped and draped in the usual sterile fashion. The area of insertion was noted on the left arm.  Patient was prepped with alcohol swab and then injected with 3 ml of 1% lidocaine.  She was prepped with betadine, Nexplanon removed from packaging,  Device confirmed in needle, then inserted full length of needle and withdrawn per handbook instructions. Nexplanon was able to palpated in the patient's arm; patient did not palpated the insert herself due to being nervous. There was minimal blood loss.  Patient insertion site covered with guaze and a pressure bandage to reduce any bruising.  The patient tolerated the procedure well and was given post procedure instructions.    Labs and Imaging No results found.     Assessment & Plan:   1. Encounter for  initial prescription of Nexplanon Now s/p uncomplicated nexplanon insertion. Reviewed postprocedure instructions.  - etonogestrel (NEXPLANON) implant 68 mg  2. Language barrier Video then in-person Spanish interpreter  Routine preventative health maintenance measures emphasized.  Darliss Cheney, MD [facility] [place]

## 2022-12-29 ENCOUNTER — Ambulatory Visit: Payer: Commercial Managed Care - HMO | Admitting: Obstetrics and Gynecology

## 2022-12-29 VITALS — BP 113/78 | HR 89 | Wt 165.2 lb

## 2022-12-29 DIAGNOSIS — N6313 Unspecified lump in the right breast, lower outer quadrant: Secondary | ICD-10-CM | POA: Diagnosis not present

## 2022-12-29 NOTE — Progress Notes (Unsigned)
    GYNECOLOGY VISIT  Patient name: Kristine Bryant MRN [id number]  Date of birth: December 31, 1985 Chief Complaint:   Gynecologic Exam  History:  Kristine Bryant is a 37 y.o. G4P1011 being seen today for right sided breast mass with increasing in size.  No nipple or breast skin changes and no nipple discharge. No FH of breast cancer. No breast pain. Noticed it on home examination  Past Medical History:  Diagnosis Date   Diabetes mellitus without complication (HCC)    GDM   Pregnancy induced hypertension    Supervision of other normal pregnancy, antepartum 02/07/2020    Nursing Staff Provider Office Location [facility] Dating  Early ultrasound  Language  Spanish  Anatomy US  Incomplete spine eval (repeat needed, see note) Anterior placenta, 9.7% by LMP (which is likely appropriate by [place] dating) Female  24th%tile at MFM Flu Vaccine  Pt requests to have at HD Genetic Screen  NIPS:   AFP:   First Screen:  Quad:  Declined   TDaP vaccine  ^see above Hgb A1C or  GTT Early  T    Past Surgical History:  Procedure Laterality Date   APPENDECTOMY      The following portions of the patient's history were reviewed and updated as appropriate: allergies, current medications, past family history, past medical history, past social history, past surgical history and problem list.   Health Maintenance:   Last pap     Component Value Date/Time   DIAGPAP  02/07/2020 1019    - Negative for intraepithelial lesion or malignancy (NILM)   HPVHIGH Negative 02/07/2020 1019   ADEQPAP Satisfactory for evaluation.  The absence of an 02/07/2020 1019   ADEQPAP  02/07/2020 1019    endocervical/transformation zone component is not uncommon in pregnant   ADEQPAP patients. 02/07/2020 1019   Last mammogram: n/a   Review of Systems:  {Ros - complete:30496} Comprehensive review of systems was otherwise negative.   Objective:  Physical Exam BP 113/78   Pulse 89   Wt 165 lb 3.2 oz (74.9 kg)   BMI 30.22 kg/m     Physical Exam   Labs and Imaging No results found.     Assessment & Plan:   There are no diagnoses linked to this encounter.   *** Routine preventative health maintenance measures emphasized.  Lorriane Shire, MD [facility], [facility]

## 2023-02-10 ENCOUNTER — Ambulatory Visit
Admission: RE | Admit: 2023-02-10 | Discharge: 2023-02-10 | Disposition: A | Discharge status: Home / Self Care | Payer: Commercial Managed Care - HMO | Source: Ambulatory Visit | Attending: Obstetrics and Gynecology | Admitting: Obstetrics and Gynecology

## 2023-02-10 DIAGNOSIS — N6313 Unspecified lump in the right breast, lower outer quadrant: Secondary | ICD-10-CM

## 2023-08-03 IMAGING — US US OB < 14 WEEKS - US OB TV
1 series · 15 of 28 positions shown · non-contrast
Comparison: None.

CLINICAL DATA: Cramping and vaginal bleeding.

EXAM:
OBSTETRIC <14 WK US AND TRANSVAGINAL OB US
TECHNIQUE: Both transabdominal and transvaginal ultrasound examinations were
performed for complete evaluation of the gestation as well as the
maternal uterus, adnexal regions, and pelvic cul-de-sac.
Transvaginal technique was performed to assess early pregnancy.

[Series 1: us ob < 14 weeks - us ob tv · 15 of 70 slices shown]
[im 1/70]
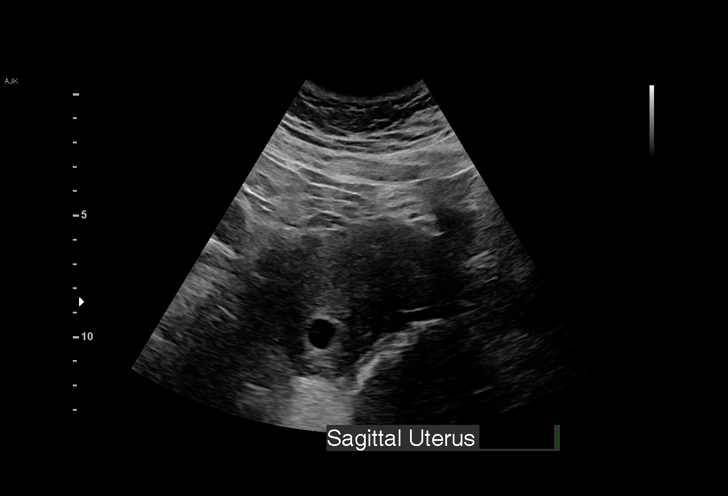
[im 6/70]
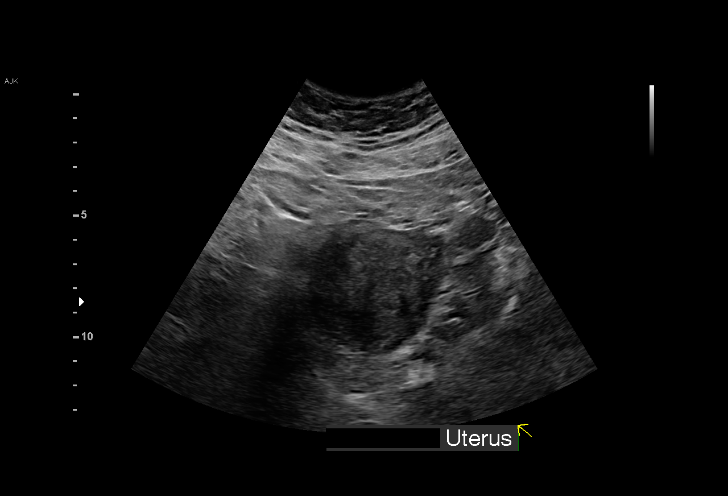
[im 11/70]
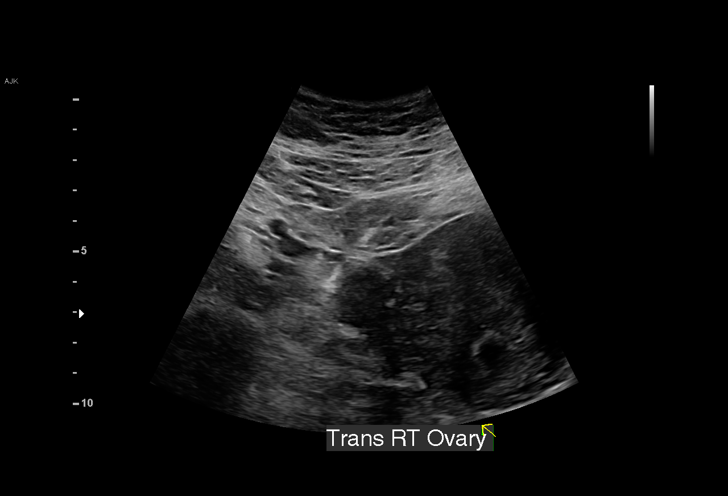
[im 16/70]
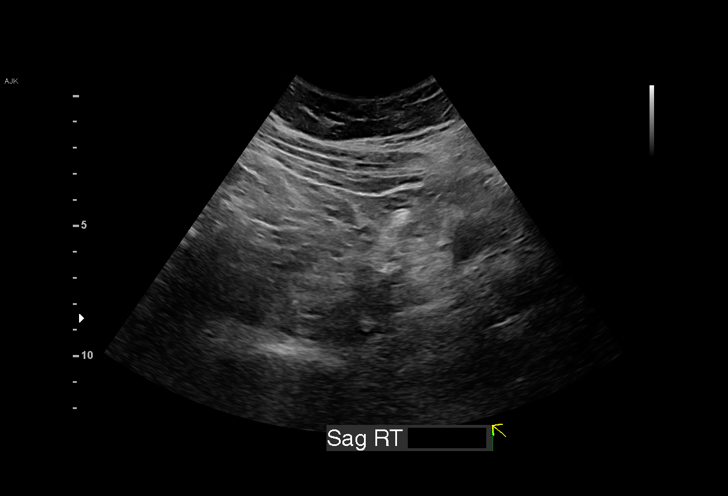
[im 21/70]
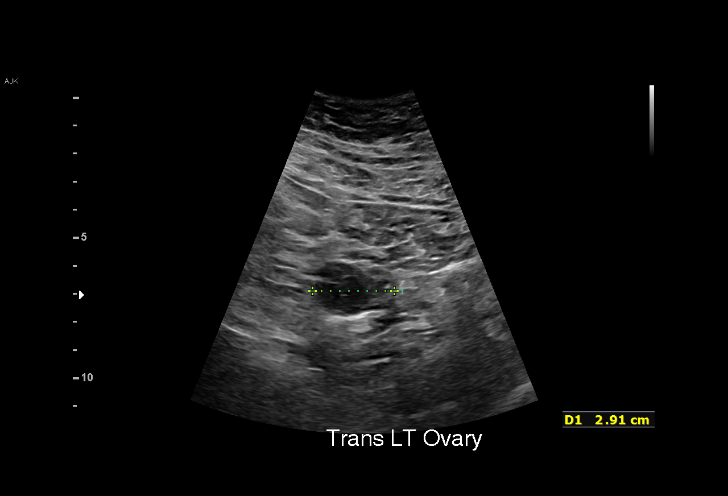
[im 26/70]
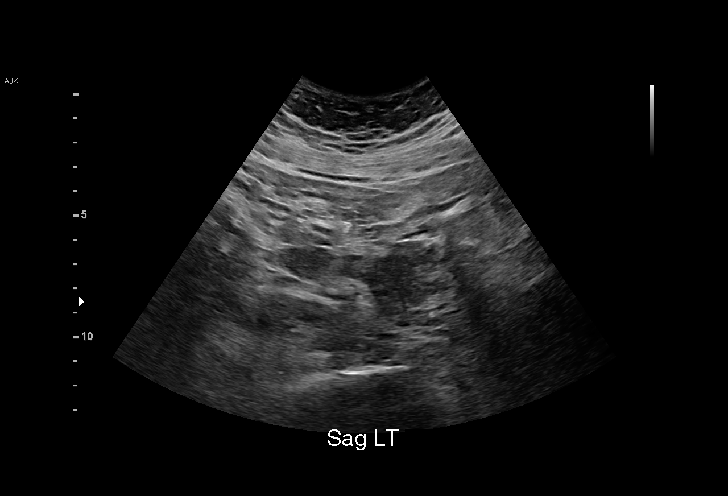
[im 31/70]
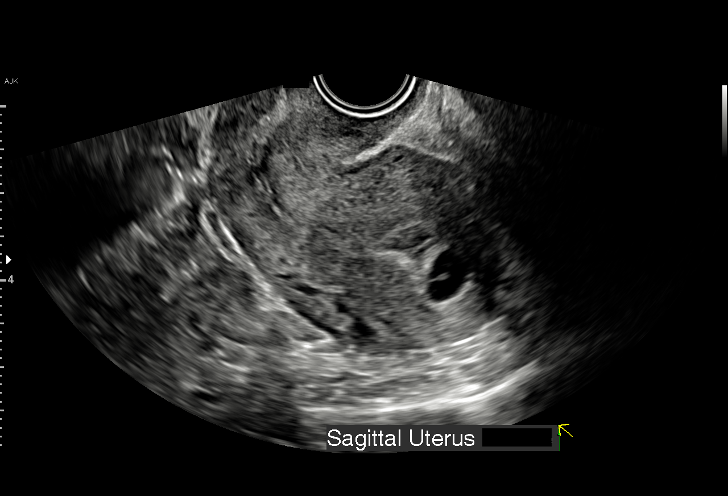
[im 36/70]
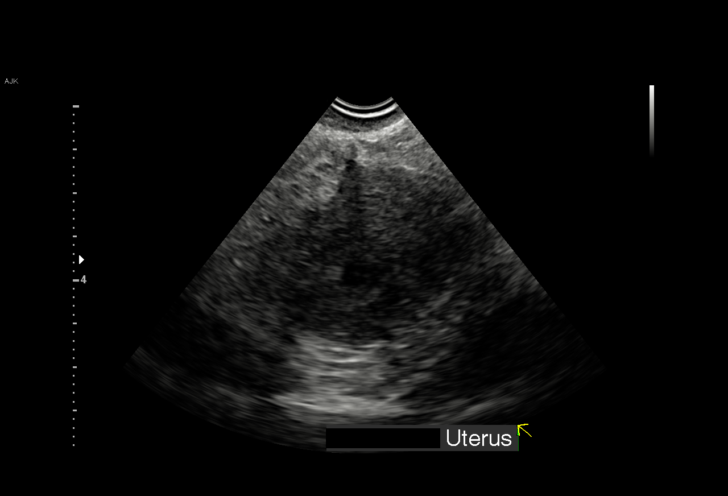
[im 39/70]
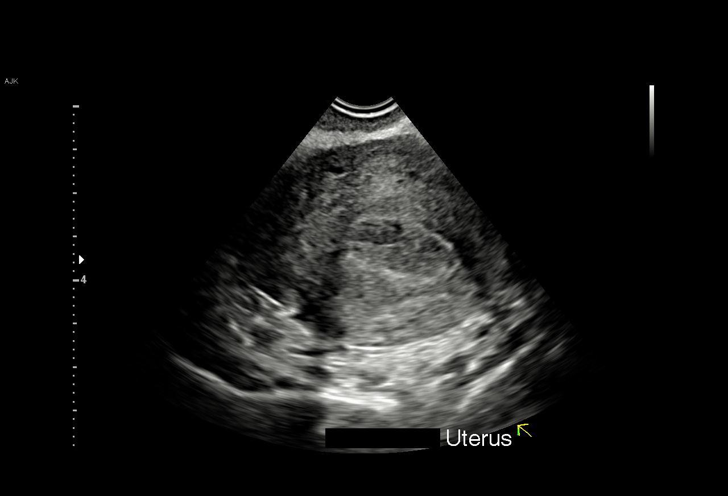
[im 44/70]
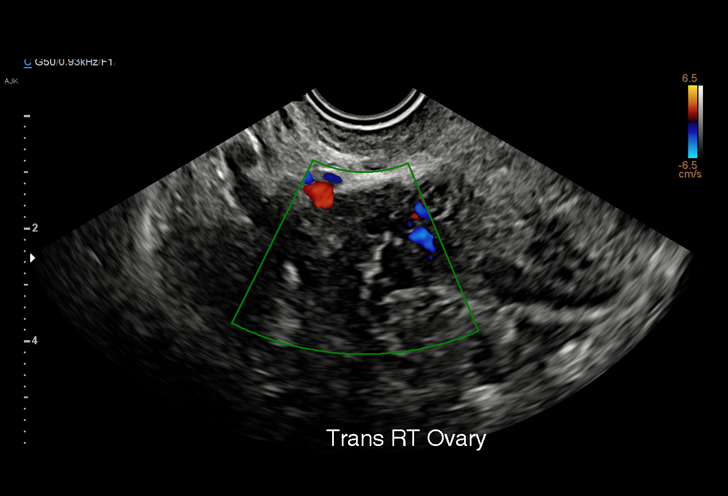
[im 49/70]
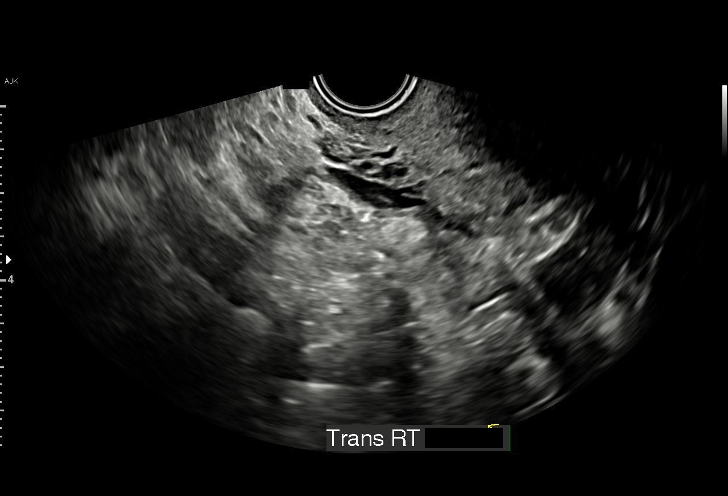
[im 54/70]
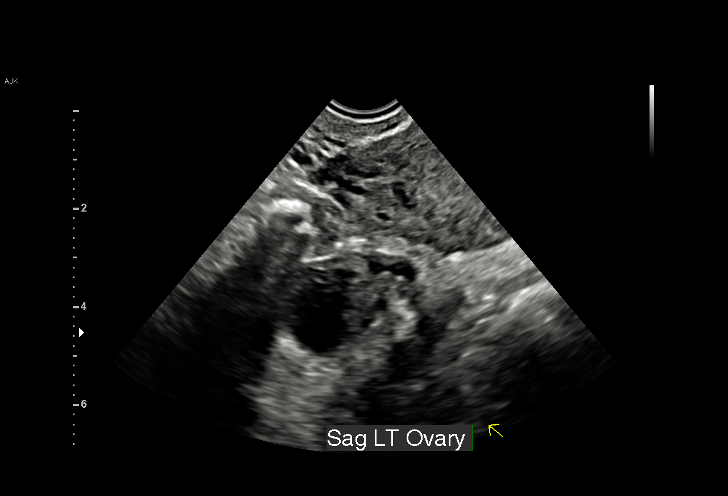
[im 59/70]
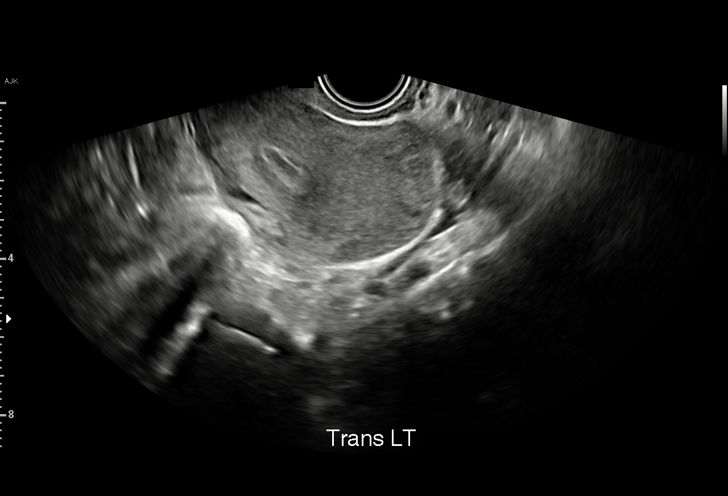
[im 64/70]
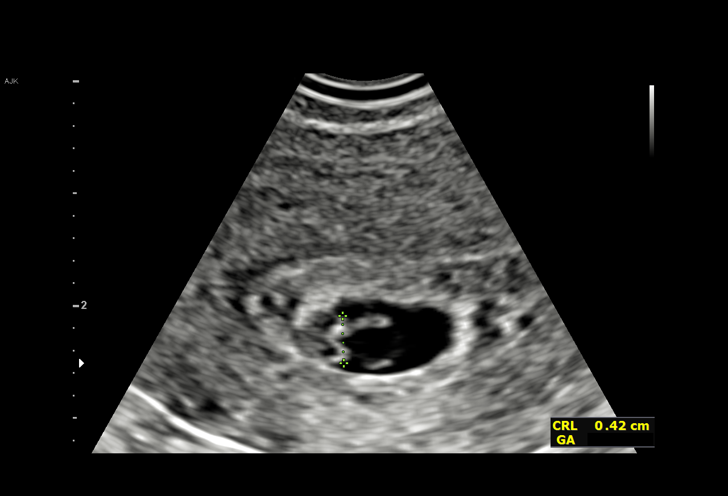
[im 70/70]
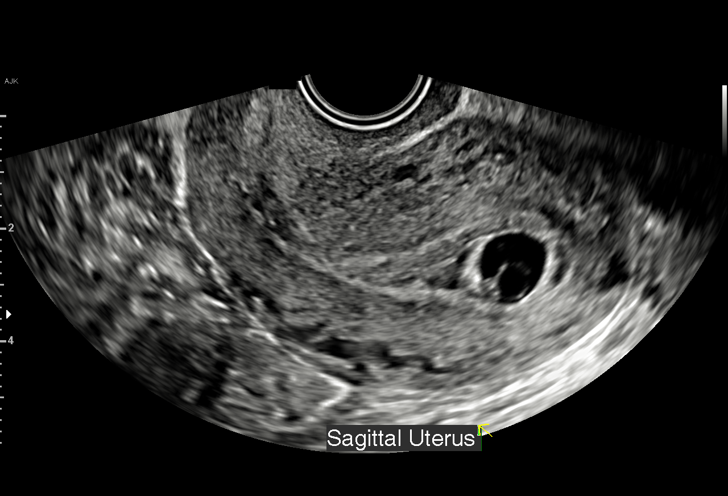

[15 of 28 positions shown; findings below may reference images not displayed]

FINDINGS: Intrauterine gestational sac: Single

Yolk sac:  Visualized.

Embryo:  Visualized.

Cardiac Activity: Visualized.

Heart Rate: 26 bpm

CRL:  4.2 mm   6 w   1 d                  US EDC: August 19, 2022

Subchorionic hemorrhage:  None visualized.

Maternal uterus/adnexae: The bilateral ovaries are visualized and
are normal in appearance.

No pelvic free fluid is seen.
IMPRESSION: 1. Single, viable intrauterine pregnancy at approximately 6 weeks
and 1 day gestation by ultrasound evaluation.
2. Fetal heart rate of 26 beats per minute. Correlation with
continued follow-up pelvic ultrasound is recommended to confirm
fetal viability.

## 2023-08-05 IMAGING — US US OB COMP LESS 14 WK
1 series · 15 of 28 positions shown · non-contrast
Comparison: January 02, 2022
COMPARISON: December 25, 2021
COMPARISON: January 02, 2022

Addendum:
CLINICAL DATA: Vaginal bleeding.

EXAM:
OBSTETRIC <14 WK US AND TRANSVAGINAL OB US
TECHNIQUE: Both transabdominal and transvaginal ultrasound examinations were
performed for complete evaluation of the gestation as well as the
maternal uterus, adnexal regions, and pelvic cul-de-sac.
Transvaginal technique was performed to assess early pregnancy.

[Series 1: us ob comp less 14 wk · 50 acquisitions, 15 frames shown]
[im 1/50]
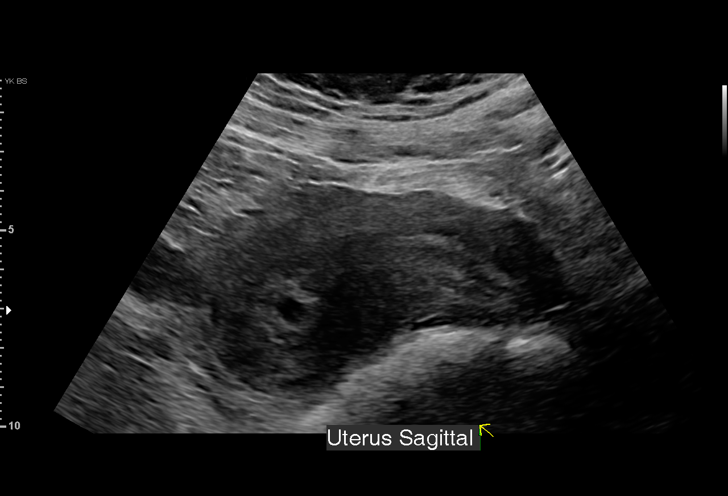
[im 4/50]
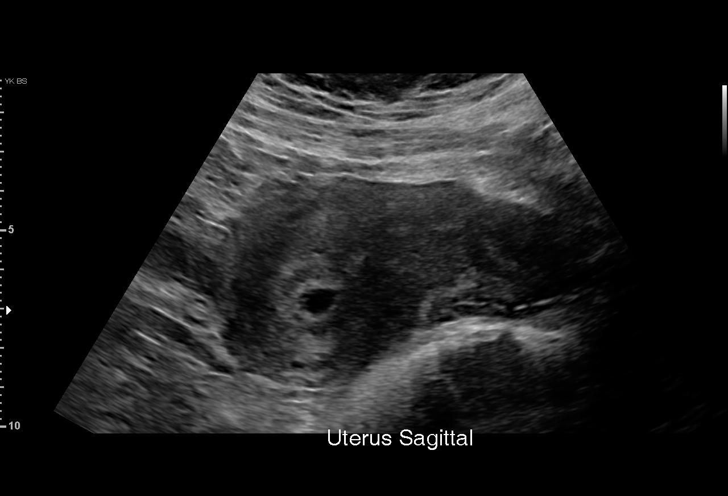
[im 8/50]
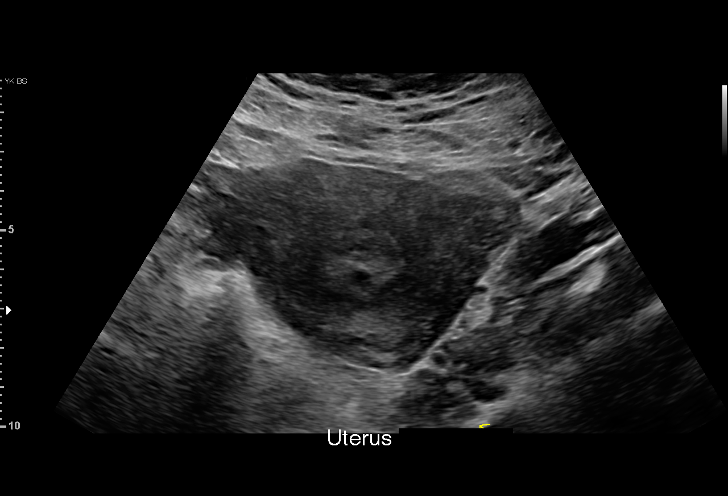
[im 11/50]
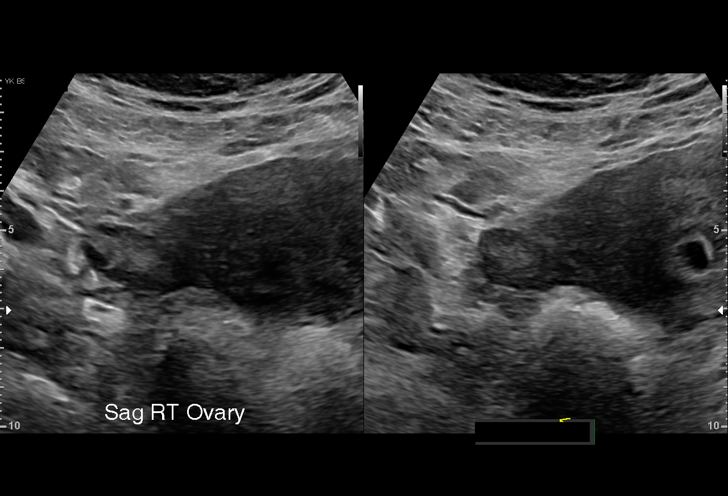
[im 15/50]
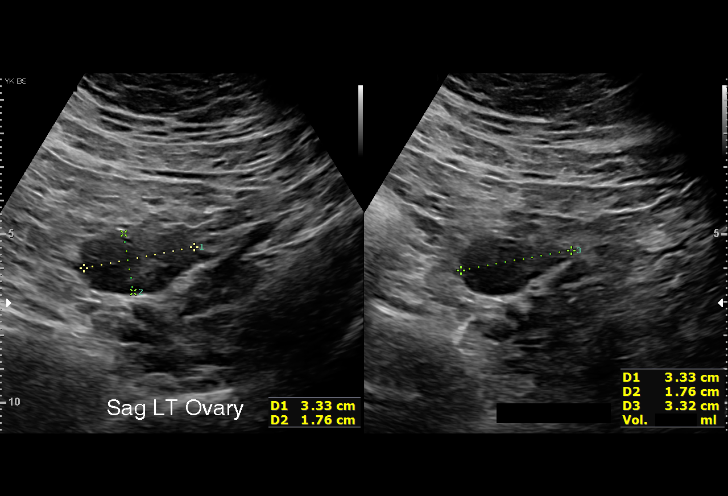
[im 19/50]
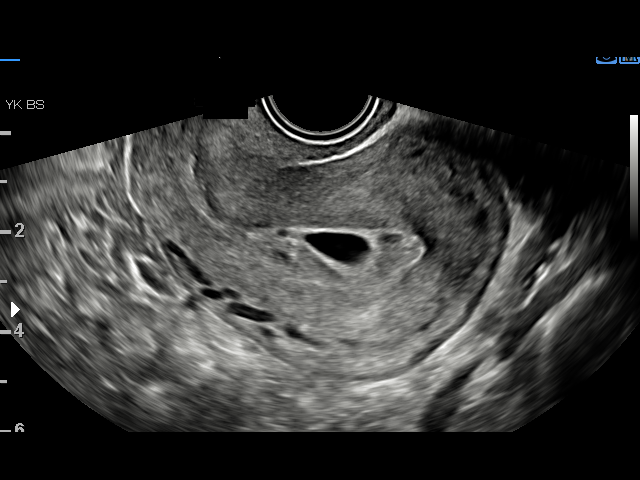
[im 22/50]
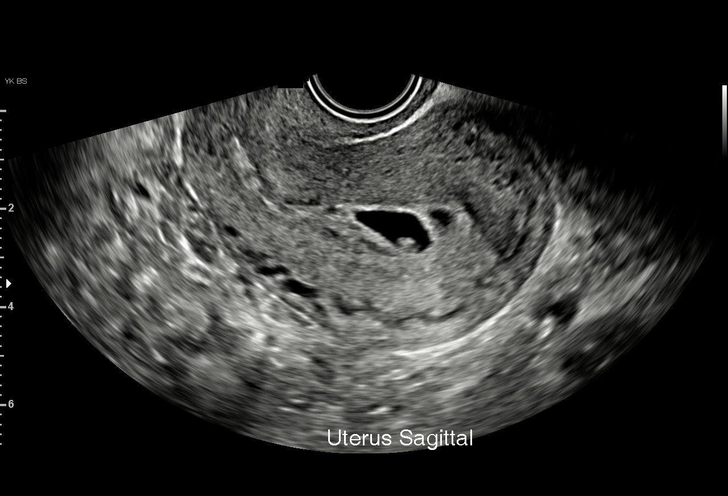
[im 26/50]
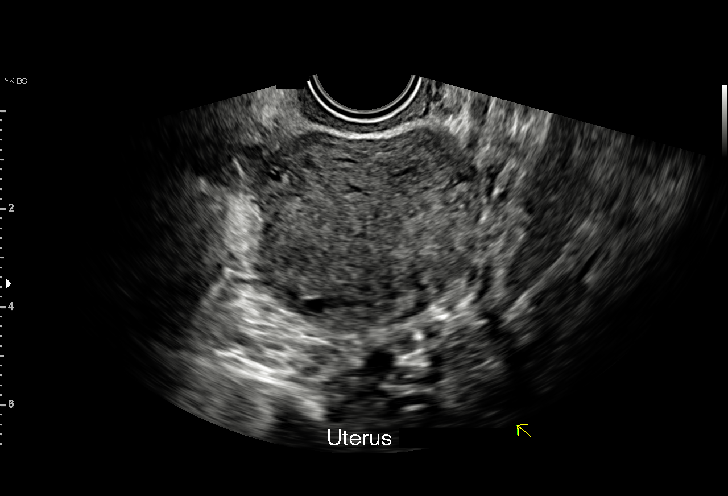
[im 28/50]
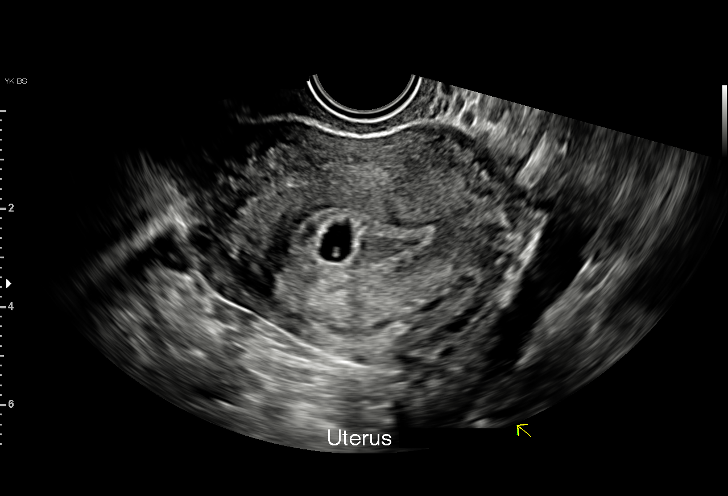
[im 31/50]
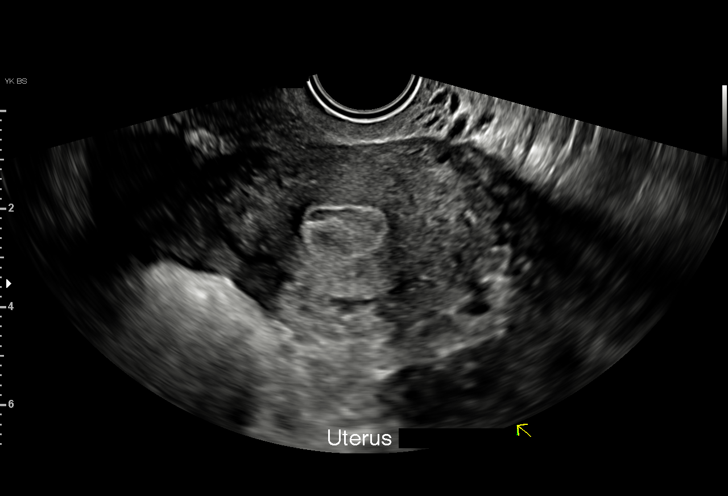
[im 35/50]
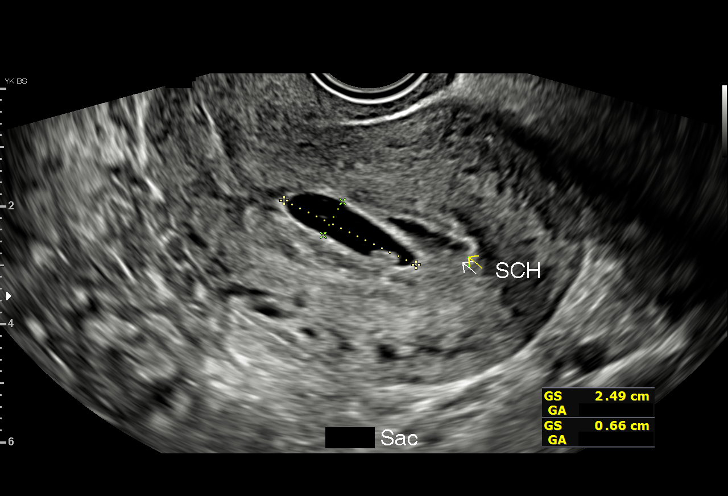
[im 39/50]
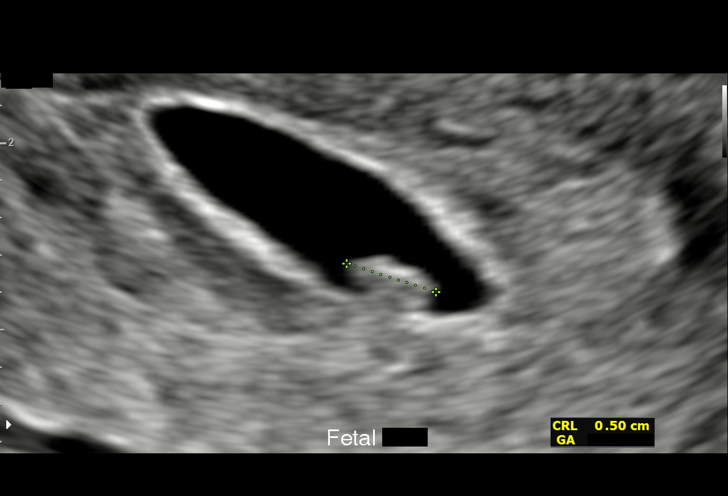
[im 42/50]
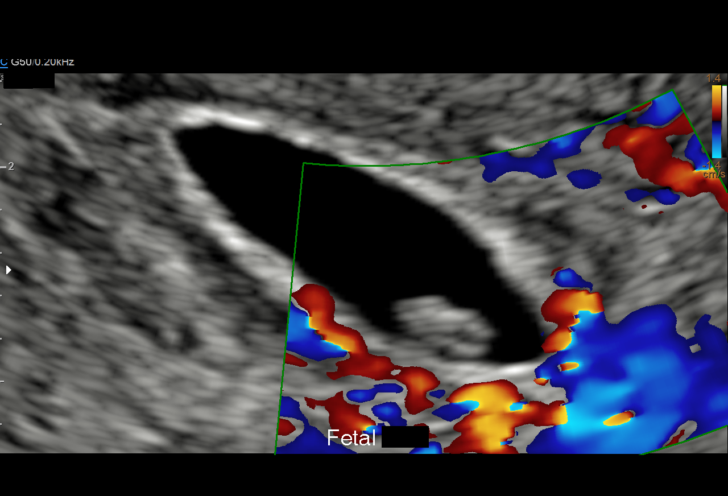
[im 46/50]
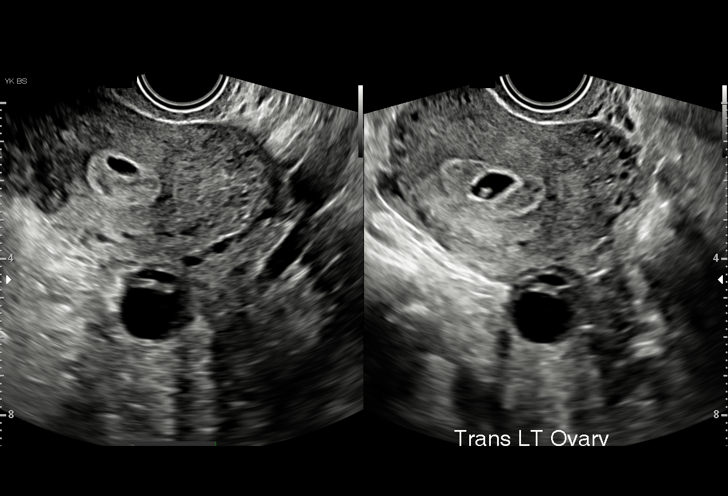
[im 50/50]
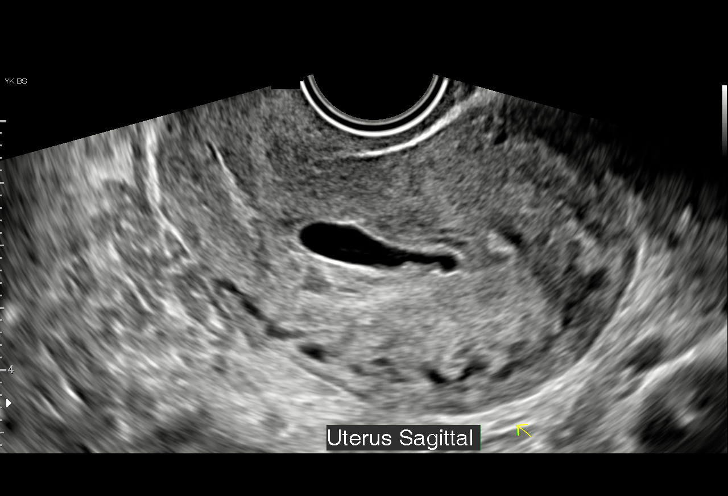

[15 of 28 positions shown; findings below may reference images not displayed]

FINDINGS: Intrauterine gestational sac: Single

Yolk sac:  Visualized.

Embryo:  Visualized.

Cardiac Activity: Not Visualized.

CRL:  4.7 mm   6 w   1 d

Subchorionic hemorrhage:  Small subchorionic hemorrhage.

Maternal uterus/adnexae: Retroflexed uterus. Small benign left
ovarian cyst.
IMPRESSION: Single nonviable intrauterine pregnancy corresponding to 6 weeks and
1 day gestation.

Small subchorionic hemorrhage.
These results were called by telephone at the time of interpretation
on 12/27/2021 at [DATE] to provider LABELLE SALHA BRACK TIGER , who verbally
acknowledged these results.

*** End of Addendum ***
FINDINGS: Intrauterine gestational sac: Single

Yolk sac:  Visualized.

Embryo:  Visualized.

Cardiac Activity: Not Visualized.

CRL:  4.7 mm   6 w   1 d

Subchorionic hemorrhage:  Small subchorionic hemorrhage.

Maternal uterus/adnexae: Retroflexed uterus. Small benign left
ovarian cyst.
IMPRESSION: Single nonviable intrauterine pregnancy corresponding to 6 weeks and
1 day gestation.

Small subchorionic hemorrhage.

## 2023-08-17 IMAGING — US US OB TRANSVAGINAL
1 series · 15 of 28 positions shown · non-contrast
Comparison: 12/27/2021

CLINICAL DATA: Bleeding, viability

EXAM:
OBSTETRIC <14 WK ULTRASOUND
TECHNIQUE: Transabdominal ultrasound was performed for evaluation of the
gestation as well as the maternal uterus and adnexal regions.

[Series 1: us ob transvaginal · 15 of 76 slices shown]
[im 1/76]
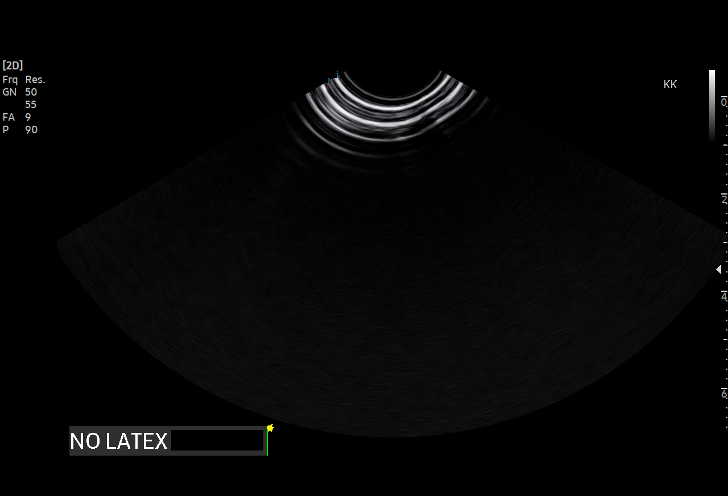
[im 6/76]
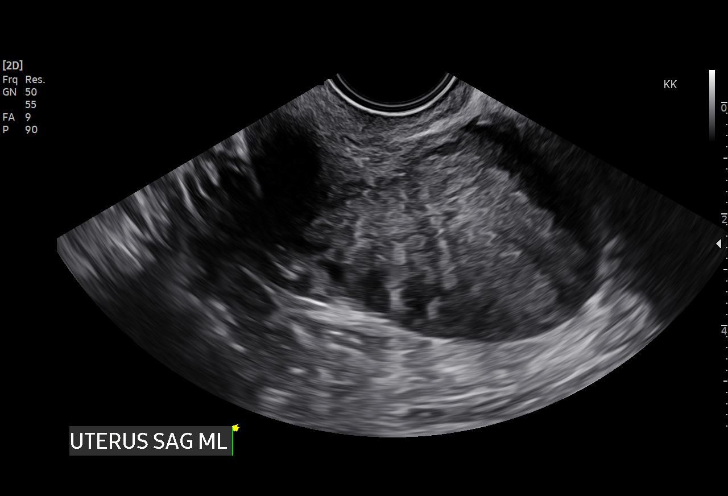
[im 12/76]
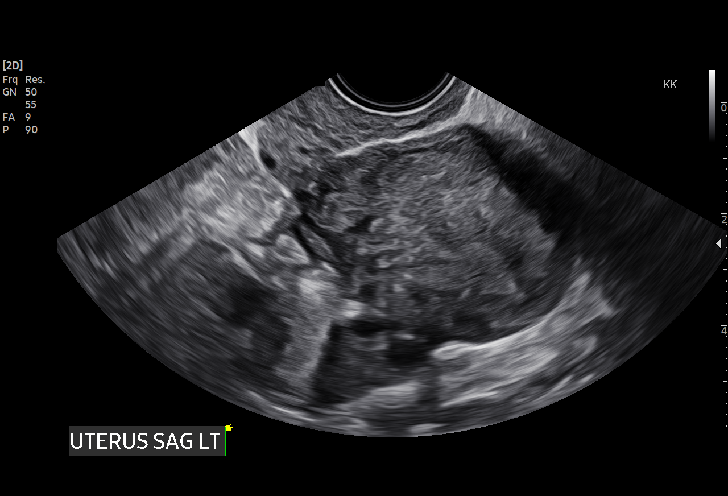
[im 17/76]
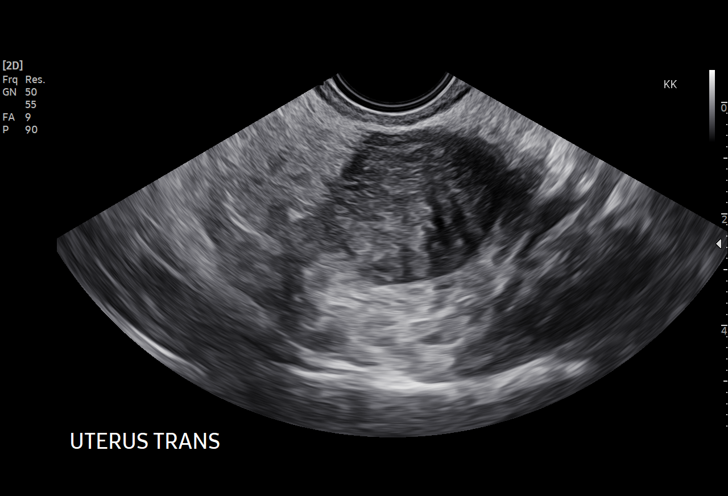
[im 23/76]
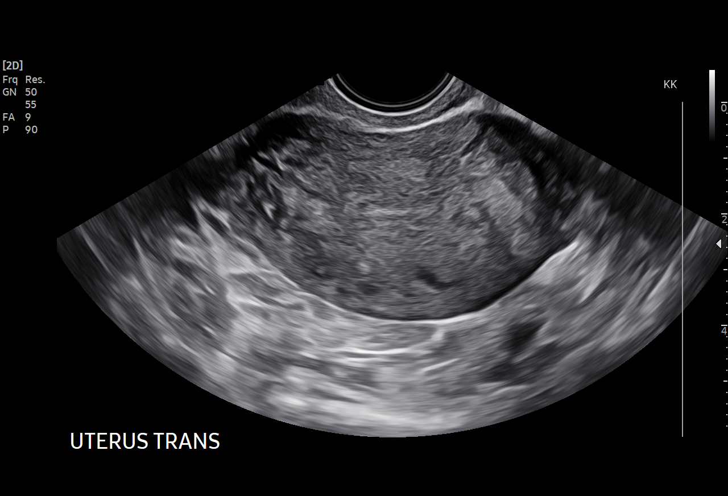
[im 28/76]
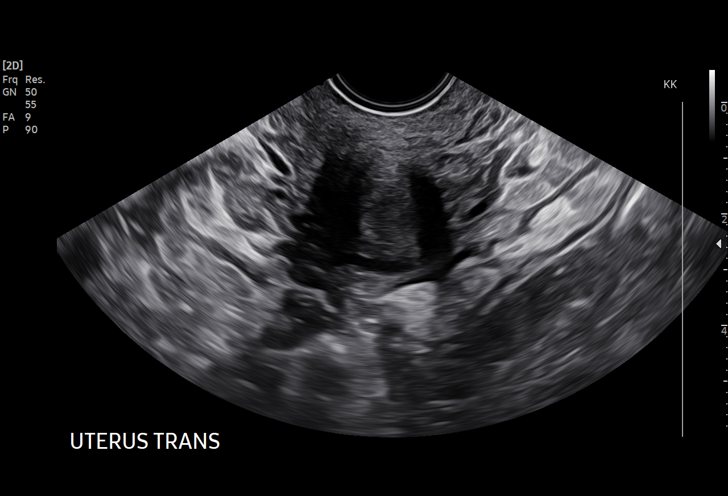
[im 34/76]
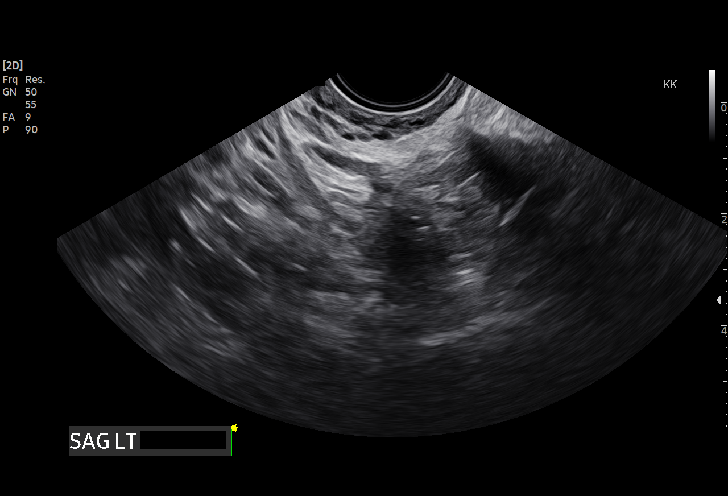
[im 39/76]
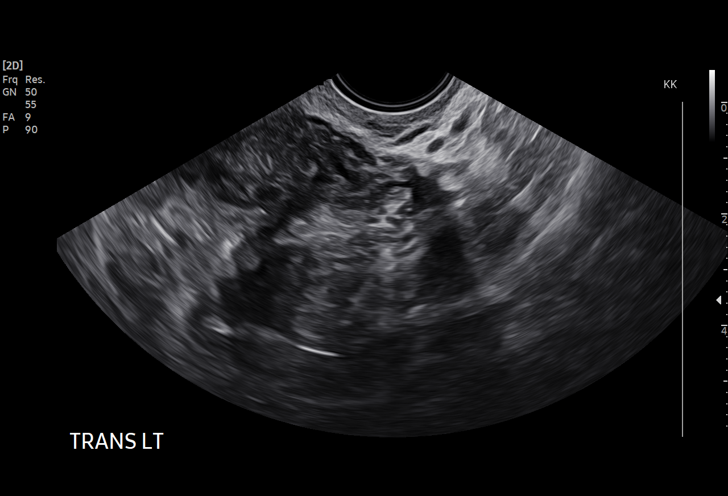
[im 42/76]
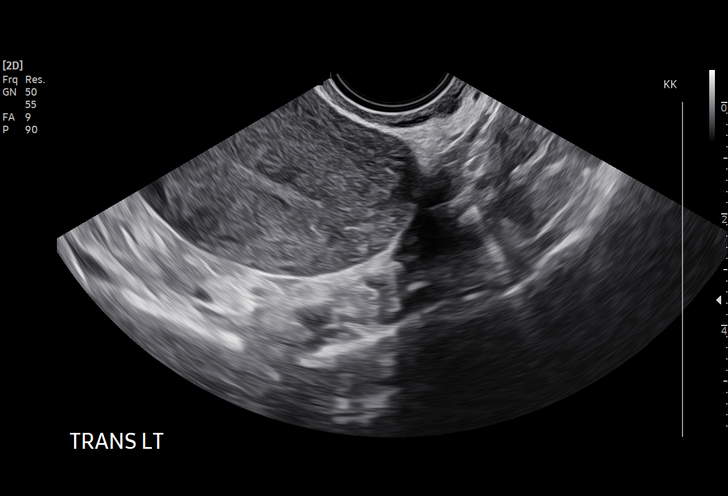
[im 48/76]
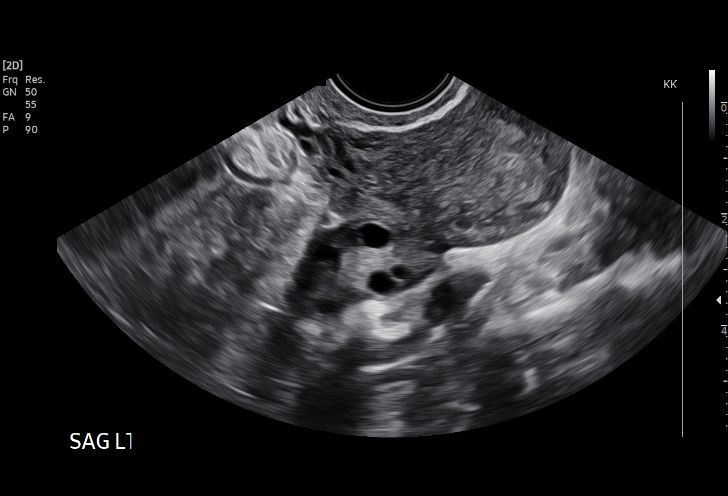
[im 53/76]
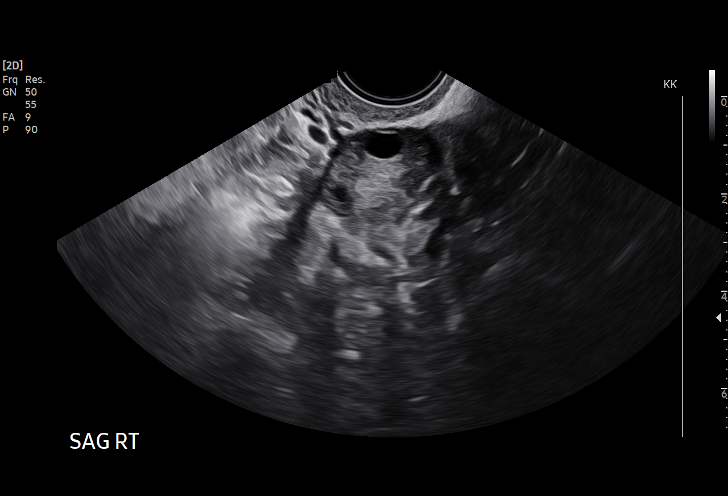
[im 59/76]
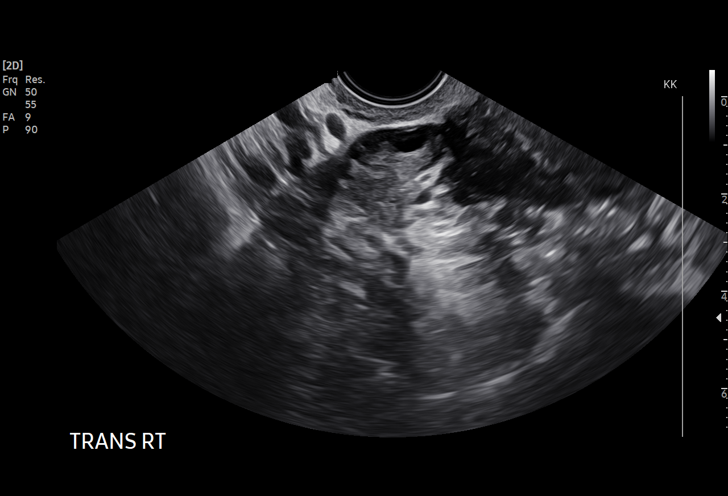
[im 64/76]
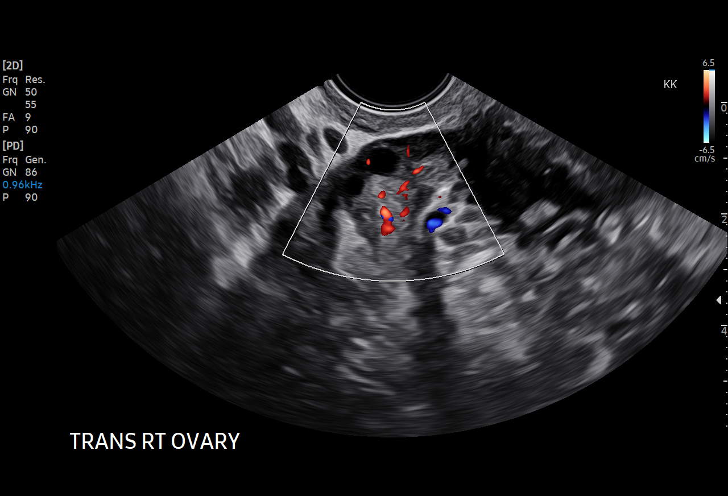
[im 70/76]
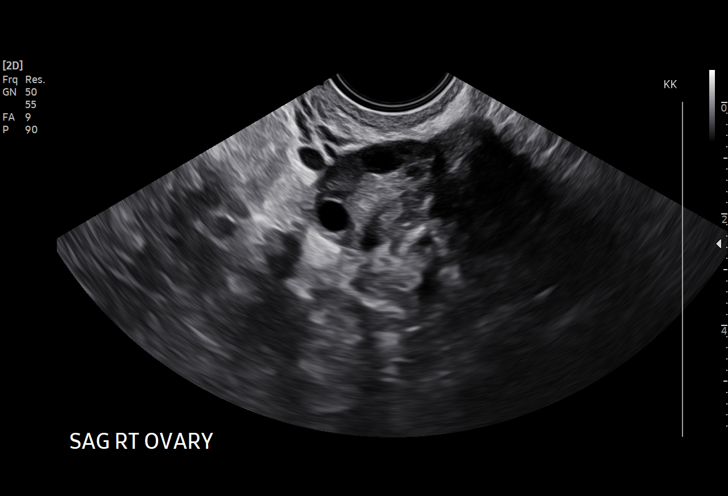
[im 76/76]
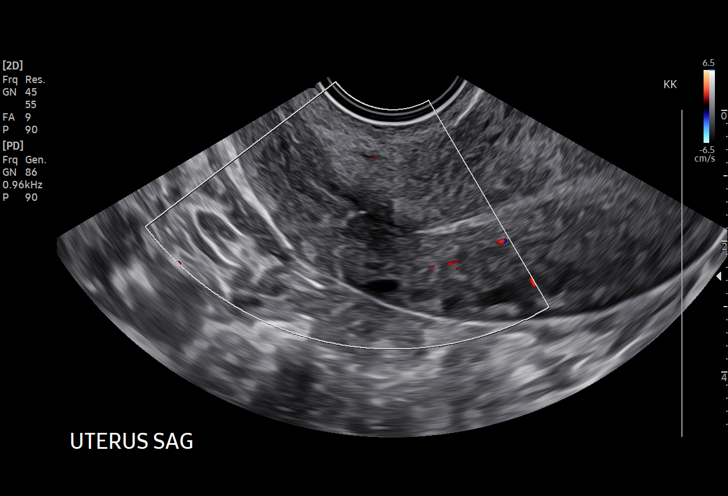

[15 of 28 positions shown; findings below may reference images not displayed]

FINDINGS: Intrauterine gestational sac: None

Yolk sac:  Not Visualized.

Embryo:  Not Visualized.

Cardiac Activity: Not Visualized.

Subchorionic hemorrhage:  None visualized.

Maternal uterus/adnexae: Retroverted uterus. No focal uterine
abnormality. No adnexal mass. No pelvic free fluid.
IMPRESSION: No intrauterine pregnancy is identified. Findings most consistent
with missed abortion.

## 2024-09-25 ENCOUNTER — Inpatient Hospital Stay (HOSPITAL_COMMUNITY)
Admission: AD | Admit: 2024-09-25 | Discharge: 2024-09-25 | Disposition: A | Discharge status: Home / Self Care | Attending: Obstetrics and Gynecology | Admitting: Obstetrics and Gynecology

## 2024-09-25 ENCOUNTER — Other Ambulatory Visit: Payer: Self-pay

## 2024-09-25 DIAGNOSIS — Z975 Presence of (intrauterine) contraceptive device: Secondary | ICD-10-CM | POA: Diagnosis not present

## 2024-09-25 DIAGNOSIS — Z3202 Encounter for pregnancy test, result negative: Secondary | ICD-10-CM | POA: Insufficient documentation

## 2024-09-25 DIAGNOSIS — O209 Hemorrhage in early pregnancy, unspecified: Secondary | ICD-10-CM | POA: Diagnosis present

## 2024-09-25 DIAGNOSIS — Z3201 Encounter for pregnancy test, result positive: Secondary | ICD-10-CM | POA: Diagnosis present

## 2024-09-25 DIAGNOSIS — R109 Unspecified abdominal pain: Secondary | ICD-10-CM | POA: Diagnosis present

## 2024-09-25 LAB — HCG, QUANTITATIVE, PREGNANCY: hCG, Beta Chain, Quant, S: <1 m[IU]/mL

## 2024-09-25 LAB — POCT PREGNANCY, URINE: Preg Test, Ur: NEGATIVE

## 2024-09-25 NOTE — MAU Note (Signed)
 Kristine Bryant is a 39 y.o. at Unknown here in MAU reporting: she had +HPT last week and is now having lower abdominal pain that began this morning @ 0300.  Reports also having spotting.  LMP: 07/27/2024 Onset of complaint: today Pain score: 10 Vitals:   09/25/24 1102  BP: 113/73  Pulse: 84  Resp: 19  Temp: 98.7 F (37.1 C)  SpO2: 100%     FHT: NA  Lab orders placed from triage: UPT

## 2024-09-25 NOTE — Discharge Instructions (Signed)
 You came into the hospital because you had a positive home pregnancy test and then started having bleeding last night. We did urine and blood pregnancy tests that were negative here in the hospital. It is likely that your Nexplanon  stopped the development of the pregnancy. I have sent a message to your clinic to schedule you for an appointment to have your Nexplanon  removed and to discuss pregnancy at your age per your request. ------ Viniste al hospital porque tuviste una prueba de embarazo positiva en casa y luego comenzaste a geophysicist/field seismologist anoche. Hicimos pruebas de embarazo en orina y sangre que resultaron negativas aqu en el hospital. Es probable que tu Nexplanon  haya detenido el desarrollo del psychiatrist. He enviado un mensaje a tu clnica para programar una cita para que te retiren el Nexplanon  y para discutir el embarazo a tu edad segn tu solicitud.

## 2024-09-25 NOTE — MAU Provider Note (Signed)
 Chief Complaint:  Abdominal Pain and Spotting   HPI   None     Kristine Bryant is a 39 y.o. G4P1011 at Unknown who presents to maternity admissions reporting multiple home pregnancy tests over the weekend with lower abdominal pain and vaginal spotting that started this morning at 3am. Has a Nexplanon  in place since end of 2023.  Past Medical History:  Diagnosis Date   Diabetes mellitus without complication (HCC)    GDM   Pregnancy induced hypertension    Supervision of other normal pregnancy, antepartum 02/07/2020    Nursing Staff Provider Office Location [facility] Dating  Early ultrasound  Language  Spanish  Anatomy US   Incomplete spine eval (repeat needed, see note) Anterior placenta, 9.7% by LMP (which is likely appropriate by US  dating) Female  24th%tile at MFM Flu Vaccine  Pt requests to have at HD Genetic Screen  NIPS:   AFP:   First Screen:  Quad:  Declined   TDaP vaccine  ^see above Hgb A1C or  GTT Early  T   OB History  Gravida Para Term Preterm AB Living  4 1 1  0 1 1  SAB IAB Ectopic Multiple Live Births   1  0 1    # Outcome Date GA Lbr Len/2nd Weight Sex Type Anes PTL Lv  4 Gravida           3 Term 07/24/20 [redacted]w[redacted]d 33:25 / 00:30 2722 g F Vag-Spont EPI  LIV     Birth Comments: WDL  2 IAB           1 Gravida            Past Surgical History:  Procedure Laterality Date   APPENDECTOMY     No family history on file. Social History[1] Allergies[2] No medications prior to admission.    I have reviewed patient's Past Medical Hx, Surgical Hx, Family Hx, Social Hx, medications and allergies.   ROS  Pertinent items noted in HPI and remainder of comprehensive ROS otherwise negative.   PHYSICAL EXAM  Patient Vitals for the past 24 hrs:  BP Temp Temp src Pulse Resp SpO2 Height Weight  09/25/24 1102 113/73 98.7 F (37.1 C) Oral 84 19 100 % -- --  09/25/24 1057 -- -- -- -- -- -- 5' 2 (1.575 m) 83.9 kg    Constitutional: Well-developed, well-nourished female in no acute  distress.  Cardiovascular: normal rate & rhythm, warm and well-perfused Respiratory: normal effort, no problems with respiration noted GI: Abd soft, non-tender, non-distended MS: Extremities nontender, no edema, normal ROM Neurologic: Alert and oriented x 4.  GU: no CVA tenderness Pelvic: normal external female genitalia, physiologic discharge, no blood, cervix clean.        Labs: Results for orders placed or performed during the hospital encounter of 09/25/24 (from the past 24 hours)  Pregnancy, urine POC     Status: None   Collection Time: 09/25/24 11:10 AM  Result Value Ref Range   Preg Test, Ur NEGATIVE NEGATIVE  hCG, quantitative, pregnancy     Status: None   Collection Time: 09/25/24 11:56 AM  Result Value Ref Range   hCG, Beta Chain, Quant, S <1 <5 mIU/mL    Imaging:  No results found.  MDM & MAU COURSE  MDM: Low  MAU Course: Orders Placed This Encounter  Procedures   hCG, quantitative, pregnancy   Pregnancy, urine POC   Discharge patient   No orders of the defined types were placed in this encounter.  VSS. Exam unremarkable. Urine and quant hCG negative, consistent with no pregnancy. Discussed results with patient with assistance of an in-person Spanish interpreter. Tearful as she desires pregnancy and would like Nexplanon  removed. Sent message to her clinic to schedule for an appointment for Nexplanon  removal and pre-conception counseling.  All questions answered prior to discharge.  ASSESSMENT   1. Negative pregnancy test   2. Nexplanon  in place     PLAN  Discharge home in stable condition with return precautions.  Follow up in clinic for Nexplanon  removal and pre-conception counseling.    Allergies as of 09/25/2024   No Known Allergies      Medication List     TAKE these medications    acetaminophen  650 MG CR tablet Commonly known as: TYLENOL  Take 650 mg by mouth every 8 (eight) hours as needed for pain.   prenatal multivitamin Tabs  tablet Take 1 tablet by mouth daily at 12 noon.        Charlie Courts, MD  Family Medicine - Obstetrics Fellow        [1]  Social History Tobacco Use   Smoking status: Never   Smokeless tobacco: Never  Vaping Use   Vaping status: Never Used  Substance Use Topics   Alcohol use: Not Currently    Comment: SOCIAL- LITTLE   Drug use: Never  [2] No Known Allergies

## 2024-11-03 ENCOUNTER — Ambulatory Visit: Payer: Self-pay | Admitting: Obstetrics and Gynecology
